# Patient Record
Sex: Female | Born: 1990 | Hispanic: Yes | Marital: Single | Smoking: Never smoker
Health system: Southern US, Community
[De-identification: ages and names within clinical notes are randomized; demographics above are authoritative.]

## PROBLEM LIST (undated history)

## (undated) ENCOUNTER — Inpatient Hospital Stay (HOSPITAL_COMMUNITY): Payer: Self-pay

## (undated) HISTORY — PX: NO PAST SURGERIES: SHX2092

---
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
## 2012-03-17 ENCOUNTER — Inpatient Hospital Stay (HOSPITAL_COMMUNITY)
Admission: AD | Admit: 2012-03-17 | Discharge: 2012-03-18 | DRG: 775 | Disposition: A | Discharge status: Home / Self Care | Payer: Medicaid Other | Source: Ambulatory Visit | Attending: Obstetrics & Gynecology | Admitting: Obstetrics & Gynecology

## 2012-03-17 ENCOUNTER — Inpatient Hospital Stay (HOSPITAL_COMMUNITY): Payer: Medicaid Other | Admitting: Anesthesiology

## 2012-03-17 ENCOUNTER — Encounter (HOSPITAL_COMMUNITY): Payer: Self-pay | Admitting: Anesthesiology

## 2012-03-17 ENCOUNTER — Encounter (HOSPITAL_COMMUNITY): Payer: Self-pay | Admitting: *Deleted

## 2012-03-17 DIAGNOSIS — O0930 Supervision of pregnancy with insufficient antenatal care, unspecified trimester: Secondary | ICD-10-CM

## 2012-03-17 DIAGNOSIS — IMO0001 Reserved for inherently not codable concepts without codable children: Secondary | ICD-10-CM

## 2012-03-17 LAB — CBC
HCT: 32.1 % — ABNORMAL LOW (ref 36.0–46.0)
Hemoglobin: 10.5 g/dL — ABNORMAL LOW (ref 12.0–15.0)
RBC: 3.78 MIL/uL — ABNORMAL LOW (ref 3.87–5.11)
RDW: 14.9 % (ref 11.5–15.5)
WBC: 10.9 10*3/uL — ABNORMAL HIGH (ref 4.0–10.5)

## 2012-03-17 LAB — DIFFERENTIAL
Basophils Absolute: 0 10*3/uL (ref 0.0–0.1)
Lymphocytes Relative: 19 % (ref 12–46)
Monocytes Absolute: 1.2 10*3/uL — ABNORMAL HIGH (ref 0.1–1.0)
Neutro Abs: 7.6 10*3/uL (ref 1.7–7.7)

## 2012-03-17 LAB — RPR: RPR Ser Ql: NONREACTIVE

## 2012-03-17 LAB — RUBELLA SCREEN: Rubella: 187.8 IU/mL — ABNORMAL HIGH

## 2012-03-17 MED ORDER — LACTATED RINGERS IV SOLN: 500.0000 mL | INTRAVENOUS | Status: DC | PRN

## 2012-03-17 MED ORDER — FENTANYL CITRATE 0.05 MG/ML IJ SOLN
50.0000 ug | INTRAMUSCULAR | Status: DC | PRN
  Administered 2012-03-17: 50 ug via INTRAVENOUS

## 2012-03-17 MED ORDER — SIMETHICONE 80 MG PO CHEW: 80.0000 mg | CHEWABLE_TABLET | ORAL | Status: DC | PRN

## 2012-03-17 MED ORDER — MAGNESIUM HYDROXIDE 400 MG/5ML PO SUSP: 30.0000 mL | ORAL | Status: DC | PRN

## 2012-03-17 MED ORDER — OXYCODONE-ACETAMINOPHEN 5-325 MG PO TABS: 1.0000 | ORAL_TABLET | ORAL | Status: DC | PRN

## 2012-03-17 MED ORDER — ONDANSETRON HCL 4 MG PO TABS: 4.0000 mg | ORAL_TABLET | ORAL | Status: DC | PRN

## 2012-03-17 MED ORDER — PRENATAL MULTIVITAMIN CH
1.0000 | ORAL_TABLET | Freq: Every day | ORAL | Status: DC
  Administered 2012-03-17 – 2012-03-18 (×2): 1 via ORAL
  Filled 2012-03-17 (×2): qty 1

## 2012-03-17 MED ORDER — IBUPROFEN 600 MG PO TABS
600.0000 mg | ORAL_TABLET | Freq: Four times a day (QID) | ORAL | Status: DC | PRN
  Administered 2012-03-17: 600 mg via ORAL
  Filled 2012-03-17: qty 1

## 2012-03-17 MED ORDER — FENTANYL 2.5 MCG/ML BUPIVACAINE 1/10 % EPIDURAL INFUSION (WH - ANES)
14.0000 mL/h | INTRAMUSCULAR | Status: DC
  Filled 2012-03-17: qty 125

## 2012-03-17 MED ORDER — INFLUENZA VIRUS VACC SPLIT PF IM SUSP
0.5000 mL | INTRAMUSCULAR | Status: AC
  Administered 2012-03-18: 0.5 mL via INTRAMUSCULAR
  Filled 2012-03-17: qty 0.5

## 2012-03-17 MED ORDER — CITRIC ACID-SODIUM CITRATE 334-500 MG/5ML PO SOLN: 30.0000 mL | ORAL | Status: DC | PRN

## 2012-03-17 MED ORDER — METHYLERGONOVINE MALEATE 0.2 MG/ML IJ SOLN: 0.2000 mg | INTRAMUSCULAR | Status: DC | PRN

## 2012-03-17 MED ORDER — PHENYLEPHRINE 40 MCG/ML (10ML) SYRINGE FOR IV PUSH (FOR BLOOD PRESSURE SUPPORT)
80.0000 ug | PREFILLED_SYRINGE | INTRAVENOUS | Status: DC | PRN
  Filled 2012-03-17: qty 5

## 2012-03-17 MED ORDER — FERROUS SULFATE 325 (65 FE) MG PO TABS
325.0000 mg | ORAL_TABLET | Freq: Two times a day (BID) | ORAL | Status: DC
  Administered 2012-03-17 – 2012-03-18 (×4): 325 mg via ORAL
  Filled 2012-03-17 (×4): qty 1

## 2012-03-17 MED ORDER — TETANUS-DIPHTH-ACELL PERTUSSIS 5-2.5-18.5 LF-MCG/0.5 IM SUSP: 0.5000 mL | Freq: Once | INTRAMUSCULAR | Status: DC

## 2012-03-17 MED ORDER — LANOLIN HYDROUS EX OINT: TOPICAL_OINTMENT | CUTANEOUS | Status: DC | PRN

## 2012-03-17 MED ORDER — SODIUM BICARBONATE 8.4 % IV SOLN
INTRAVENOUS | Status: DC | PRN
  Administered 2012-03-17: 5 mL via EPIDURAL

## 2012-03-17 MED ORDER — MEASLES, MUMPS & RUBELLA VAC ~~LOC~~ INJ
0.5000 mL | INJECTION | Freq: Once | SUBCUTANEOUS | Status: DC
  Filled 2012-03-17: qty 0.5

## 2012-03-17 MED ORDER — ACETAMINOPHEN 325 MG PO TABS: 650.0000 mg | ORAL_TABLET | ORAL | Status: DC | PRN

## 2012-03-17 MED ORDER — METHYLERGONOVINE MALEATE 0.2 MG PO TABS: 0.2000 mg | ORAL_TABLET | ORAL | Status: DC | PRN

## 2012-03-17 MED ORDER — ZOLPIDEM TARTRATE 5 MG PO TABS: 5.0000 mg | ORAL_TABLET | Freq: Every evening | ORAL | Status: DC | PRN

## 2012-03-17 MED ORDER — FENTANYL 2.5 MCG/ML BUPIVACAINE 1/10 % EPIDURAL INFUSION (WH - ANES)
INTRAMUSCULAR | Status: DC | PRN
  Administered 2012-03-17: 14 mL/h via EPIDURAL

## 2012-03-17 MED ORDER — IBUPROFEN 600 MG PO TABS
600.0000 mg | ORAL_TABLET | Freq: Four times a day (QID) | ORAL | Status: DC
  Administered 2012-03-17 – 2012-03-18 (×6): 600 mg via ORAL
  Filled 2012-03-17 (×6): qty 1

## 2012-03-17 MED ORDER — EPHEDRINE 5 MG/ML INJ
10.0000 mg | INTRAVENOUS | Status: DC | PRN
  Filled 2012-03-17: qty 4

## 2012-03-17 MED ORDER — LIDOCAINE HCL (PF) 1 % IJ SOLN
30.0000 mL | INTRAMUSCULAR | Status: DC | PRN
  Filled 2012-03-17: qty 30

## 2012-03-17 MED ORDER — DIBUCAINE 1 % RE OINT: 1.0000 "application " | TOPICAL_OINTMENT | RECTAL | Status: DC | PRN

## 2012-03-17 MED ORDER — ONDANSETRON HCL 4 MG/2ML IJ SOLN: 4.0000 mg | INTRAMUSCULAR | Status: DC | PRN

## 2012-03-17 MED ORDER — SENNOSIDES-DOCUSATE SODIUM 8.6-50 MG PO TABS
2.0000 | ORAL_TABLET | Freq: Every day | ORAL | Status: DC
  Administered 2012-03-17: 2 via ORAL

## 2012-03-17 MED ORDER — FENTANYL CITRATE 0.05 MG/ML IJ SOLN
INTRAMUSCULAR | Status: AC
  Administered 2012-03-17: 50 ug via INTRAVENOUS
  Filled 2012-03-17: qty 2

## 2012-03-17 MED ORDER — ONDANSETRON HCL 4 MG/2ML IJ SOLN: 4.0000 mg | Freq: Four times a day (QID) | INTRAMUSCULAR | Status: DC | PRN

## 2012-03-17 MED ORDER — DIPHENHYDRAMINE HCL 25 MG PO CAPS: 25.0000 mg | ORAL_CAPSULE | Freq: Four times a day (QID) | ORAL | Status: DC | PRN

## 2012-03-17 MED ORDER — WITCH HAZEL-GLYCERIN EX PADS: 1.0000 "application " | MEDICATED_PAD | CUTANEOUS | Status: DC | PRN

## 2012-03-17 MED ORDER — PHENYLEPHRINE 40 MCG/ML (10ML) SYRINGE FOR IV PUSH (FOR BLOOD PRESSURE SUPPORT): 80.0000 ug | PREFILLED_SYRINGE | INTRAVENOUS | Status: DC | PRN

## 2012-03-17 MED ORDER — OXYTOCIN 40 UNITS IN LACTATED RINGERS INFUSION - SIMPLE MED
62.5000 mL/h | Freq: Once | INTRAVENOUS | Status: DC
  Filled 2012-03-17: qty 1000

## 2012-03-17 MED ORDER — DIPHENHYDRAMINE HCL 50 MG/ML IJ SOLN: 12.5000 mg | INTRAMUSCULAR | Status: DC | PRN

## 2012-03-17 MED ORDER — EPHEDRINE 5 MG/ML INJ: 10.0000 mg | INTRAVENOUS | Status: DC | PRN

## 2012-03-17 MED ORDER — LACTATED RINGERS IV SOLN
INTRAVENOUS | Status: DC
  Administered 2012-03-17: 01:00:00 via INTRAVENOUS

## 2012-03-17 MED ORDER — BENZOCAINE-MENTHOL 20-0.5 % EX AERO
1.0000 "application " | INHALATION_SPRAY | CUTANEOUS | Status: DC | PRN
  Administered 2012-03-17: 1 via TOPICAL
  Filled 2012-03-17: qty 56

## 2012-03-17 MED ORDER — OXYTOCIN BOLUS FROM INFUSION
500.0000 mL | Freq: Once | INTRAVENOUS | Status: AC
  Administered 2012-03-17: 500 mL via INTRAVENOUS
  Filled 2012-03-17: qty 500

## 2012-03-17 MED ORDER — LACTATED RINGERS IV SOLN
500.0000 mL | Freq: Once | INTRAVENOUS | Status: AC
  Administered 2012-03-17: 500 mL via INTRAVENOUS

## 2012-03-17 NOTE — Plan of Care (Signed)
Problem: Phase I Progression Outcomes Goal: Obtain and review prenatal records Outcome: Not Met (add Reason) No prentatal records, due to no prenatal care. Only record seen is pt's visit to ER in February 2013 at [facility] 3d gestation.

## 2012-03-17 NOTE — H&P (Signed)
Maria Bruce is a 21 y.o. female G2P1001 [redacted]w[redacted]d by LMP per patient, presenting for painful contractions occurring every 2 minutes. She denies leaking, gush of fluid pr vaginal bleeding. She has felt her baby move today. She reports prenatal care at a "pay as you go facility." She brings no documents with her. She is crying and breathing through contractions. No one is with her currently for support.  Maternal Medical History:  Reason for admission: Reason for admission: contractions.  Reason for Admission:   nauseaContractions: Frequency: regular.   Perceived severity is strong.    Fetal activity: Perceived fetal activity is normal.      OB History    Grav Para Term Preterm Abortions TAB SAB Ect Mult Living   2 1 1       1      Past Medical History  Diagnosis Date  . Asthma    History reviewed. No pertinent past surgical history. Family History: family history includes Diabetes in her paternal grandmother and Hypertension in her paternal grandmother. Social History:  reports that she has never smoked. She does not have any smokeless tobacco history on file. She reports that she does not drink alcohol or use illicit drugs.   Prenatal Transfer Tool  Maternal Diabetes: No Genetic Screening: Declined Maternal Ultrasounds/Referrals: Declined Fetal Ultrasounds or other Referrals:  None Maternal Substance Abuse:  No Significant Maternal Medications:  None Significant Maternal Lab Results:  None Other Comments:  None  Review of Systems  Constitutional: Negative for fever and chills.  Eyes: Negative for blurred vision and double vision.  Respiratory: Negative for cough, shortness of breath and wheezing.   Cardiovascular: Negative for chest pain and palpitations.  Gastrointestinal: Negative for heartburn, nausea, vomiting, diarrhea and constipation.  Genitourinary: Negative for dysuria, urgency and frequency.  Musculoskeletal: Positive for back pain.  Skin: Negative for  rash.  Neurological: Negative for dizziness, weakness and headaches.      Blood pressure 116/65, pulse 76, temperature 98 F (36.7 C), temperature source Oral, resp. rate 20, height 5\' 4"  (1.626 m), weight 86.183 kg (190 lb). Dilation: 8 Effacement (%): 100 Cervical Position: Middle Station: 0 Presentation: Vertex Exam by:: kuneff, r., do   Maternal Exam:  Uterine Assessment: Contraction strength is firm.  Contraction duration is 1 minute. Contraction frequency is regular.   Abdomen: Fetal presentation: vertex  Introitus: Normal vulva. Normal vagina.  Vagina is negative for discharge.  Ferning test: not done.  Nitrazine test: not done.  Pelvis: adequate for delivery.   Cervix: Cervix evaluated by digital exam.     Fetal Exam Fetal Monitor Review: Variability: moderate (6-25 bpm).   Pattern: accelerations present and no decelerations.    Fetal State Assessment: Category I - tracings are normal.     Physical Exam  Constitutional: She is oriented to person, place, and time. She appears well-developed and well-nourished. No distress.  HENT:  Head: Normocephalic and atraumatic.  Eyes: Conjunctivae normal are normal. Pupils are equal, round, and reactive to light. Right eye exhibits no discharge. Left eye exhibits no discharge. No scleral icterus.  Neck: Neck supple.  Cardiovascular: Normal rate, regular rhythm and normal heart sounds.   No murmur heard. Respiratory: No respiratory distress. She has no wheezes. She has no rales.  GI: She exhibits no distension. There is no tenderness.       Gravid with firm contractions  Genitourinary: Vagina normal. No vaginal discharge found.  Musculoskeletal: She exhibits no edema and no tenderness.  Neurological: She is alert  and oriented to person, place, and time.  Skin: Skin is warm and dry. No rash noted. She is not diaphoretic.  Psychiatric: She has a normal mood and affect.    Results for orders placed during the hospital  encounter of 03/17/12 (from the past 48 hour(s))  CBC     Status: Abnormal   Collection Time   03/17/12  1:20 AM      Component Value Range Comment   WBC 10.9 (*) 4.0 - 10.5 K/uL    RBC 3.78 (*) 3.87 - 5.11 MIL/uL    Hemoglobin 10.5 (*) 12.0 - 15.0 g/dL    HCT 16.1 (*) 09.6 - 46.0 %    MCV 84.9  78.0 - 100.0 fL    MCH 27.8  26.0 - 34.0 pg    MCHC 32.7  30.0 - 36.0 g/dL    RDW 04.5  40.9 - 81.1 %    Platelets 206  150 - 400 K/uL   DIFFERENTIAL     Status: Abnormal   Collection Time   03/17/12  1:20 AM      Component Value Range Comment   Neutrophils Relative 69  43 - 77 %    Neutro Abs 7.6  1.7 - 7.7 K/uL    Lymphocytes Relative 19  12 - 46 %    Lymphs Abs 2.1  0.7 - 4.0 K/uL    Monocytes Relative 11  3 - 12 %    Monocytes Absolute 1.2 (*) 0.1 - 1.0 K/uL    Eosinophils Relative 1  0 - 5 %    Eosinophils Absolute 0.1  0.0 - 0.7 K/uL    Basophils Relative 0  0 - 1 %    Basophils Absolute 0.0  0.0 - 0.1 K/uL   RAPID HIV SCREEN ([facility])     Status: Normal   Collection Time   03/17/12  1:20 AM      Component Value Range Comment   SUDS Rapid HIV Screen NON REACTIVE  NON REACTIVE     Prenatal labs: Pending ABO, Rh: --/--/B POS (02/21 0138) Antibody:   Rubella:   RPR:    HBsAg:    HIV:   Negative GBS:     Assessment/Plan: 1. Active labor, presumed term 2. No prenatal care 3. OB profile pending, GBS PCR pending 4. Transfer to LD with admission orders 5. Anticipate NSVD   Felix Pacini 03/17/2012, 1:06 AM

## 2012-03-17 NOTE — Progress Notes (Signed)
UR Chart review completed.  

## 2012-03-17 NOTE — Anesthesia Preprocedure Evaluation (Signed)
Anesthesia Evaluation  Patient identified by MRN, date of birth, ID band Patient awake    Reviewed: Allergy & Precautions, H&P , Patient's Chart, lab work & pertinent test results  Airway Mallampati: II TM Distance: >3 FB Neck ROM: full    Dental  (+) Teeth Intact   Pulmonary asthma (no inhaler) ,  breath sounds clear to auscultation        Cardiovascular Rhythm:regular Rate:Normal     Neuro/Psych    GI/Hepatic   Endo/Other    Renal/GU      Musculoskeletal   Abdominal   Peds  Hematology   Anesthesia Other Findings       Reproductive/Obstetrics (+) Pregnancy                           Anesthesia Physical Anesthesia Plan  ASA: II  Anesthesia Plan: Epidural   Post-op Pain Management:    Induction:   Airway Management Planned:   Additional Equipment:   Intra-op Plan:   Post-operative Plan:   Informed Consent: I have reviewed the patients History and Physical, chart, labs and discussed the procedure including the risks, benefits and alternatives for the proposed anesthesia with the patient or authorized representative who has indicated his/her understanding and acceptance.   Dental Advisory Given  Plan Discussed with:   Anesthesia Plan Comments: (Labs checked- platelets confirmed with RN in room. Fetal heart tracing, per RN, reported to be stable enough for sitting procedure. Discussed epidural, and patient consents to the procedure:  included risk of possible headache,backache, failed block, allergic reaction, and nerve injury. This patient was asked if she had any questions or concerns before the procedure started. )        Anesthesia Quick Evaluation  

## 2012-03-17 NOTE — Anesthesia Procedure Notes (Signed)
Epidural Patient location during procedure: OB  Preanesthetic Checklist Completed: patient identified, site marked, surgical consent, pre-op evaluation, timeout performed, IV checked, risks and benefits discussed and monitors and equipment checked  Epidural Patient position: sitting Prep: site prepped and draped and DuraPrep Patient monitoring: continuous pulse ox and blood pressure Approach: midline Injection technique: LOR air  Needle:  Needle type: Tuohy  Needle gauge: 17 G Needle length: 9 cm and 9 Needle insertion depth: 5 cm cm Catheter type: closed end flexible Catheter size: 19 Gauge Catheter at skin depth: 11 cm Test dose: negative  Assessment Events: blood not aspirated, injection not painful, no injection resistance, negative IV test and no paresthesia  Additional Notes Dosing of Epidural:  1st dose, through catheter ............................................. epi 1:200K + Xylocaine 40 mg  2nd dose, through catheter, after waiting 3 minutes.....epi 1:200K + Xylocaine 60 mg    ( 2% Xylo charted as a single dose in Epic Meds for ease of charting; actual dosing was fractionated as above, for saftey's sake)  As each dose occurred, patient was free of IV sx; and patient exhibited no evidence of SA injection.  Patient is more comfortable after epidural dosed. Please see RN's note for documentation of vital signs,and FHR which are stable.  Patient reminded not to try to ambulate with numb legs, and that an RN must be present when she attempts to get up.       

## 2012-03-18 ENCOUNTER — Ambulatory Visit (INDEPENDENT_AMBULATORY_CARE_PROVIDER_SITE_OTHER): Payer: Medicaid Other | Admitting: *Deleted

## 2012-03-18 VITALS — BP 123/72 | HR 87 | Temp 98.8°F | Ht 64.0 in | Wt 194.1 lb

## 2012-03-18 DIAGNOSIS — Z3049 Encounter for surveillance of other contraceptives: Secondary | ICD-10-CM

## 2012-03-18 MED ORDER — MEDROXYPROGESTERONE ACETATE 150 MG/ML IM SUSP
150.0000 mg | Freq: Once | INTRAMUSCULAR | Status: AC
  Administered 2012-03-18: 150 mg via INTRAMUSCULAR

## 2012-03-18 MED ORDER — LANOLIN HYDROUS EX OINT: 1.0000 "application " | TOPICAL_OINTMENT | CUTANEOUS | Status: DC | PRN

## 2012-03-18 NOTE — Discharge Summary (Signed)
Obstetric Discharge Summary Reason for Admission: onset of labor Prenatal Procedures: none Intrapartum Procedures: spontaneous vaginal delivery; epidural Postpartum Procedures: none Complications-Operative and Postpartum: none Hemoglobin  Date Value Range Status  03/17/2012 10.5* 12.0 - 15.0 g/dL Final     HCT  Date Value Range Status  03/17/2012 32.1* 36.0 - 46.0 % Final    Physical Exam:  General: alert and cooperative Lochia: appropriate Uterine Fundus: firm Incision: none DVT Evaluation: No evidence of DVT seen on physical exam. Negative Homan's sign. No cords or calf tenderness.  Discharge Diagnoses: Term Pregnancy-delivered  Discharge Information: Date: 03/18/2012 Activity: pelvic rest Diet: routine for mother, bottle feeding for baby Medications: None Condition: stable Birth Control: desires Depo Shot Instructions: refer to AVS Discharge to: home   Newborn Data: Live born female  Birth Weight: 6 lb 5.4 oz (2875 g) APGAR: 9, 9  Home with mother.  Felix Pacini 03/18/2012, 8:06 AM  I have seen and examined this patient and I agree with the above. She will have her PP appt at the [facility]- office requested to make an appt for her. She will also have a SW consult due to no prenatal care prior to her discharge. NATHAN, GOPALAKRISHNAN 9:37 AM 03/18/2012

## 2012-03-18 NOTE — Anesthesia Postprocedure Evaluation (Signed)
Anesthesia Post Note  Patient: Maria Bruce  Procedure(s) Performed: * No procedures listed *  Anesthesia type: Epidural  Patient location: Mother/Baby  Post pain: Pain level controlled  Post assessment: Post-op Vital signs reviewed  Last Vitals:  Filed Vitals:   03/18/12 0540  BP: 91/51  Pulse: 82  Temp: 36.6 C  Resp: 18    Post vital signs: Reviewed  Level of consciousness: awake  Complications: No apparent anesthesia complications

## 2012-03-18 NOTE — Clinical Social Work Maternal (Signed)
Clinical Social Work Department PSYCHOSOCIAL ASSESSMENT - MATERNAL/CHILD 03/18/2012  Patient:  Maria Bruce,Maria Bruce  Account Number:  [id number]  Admit Date:  03/17/2012  Childs Name:   Isaiah Rodriguez    Clinical Social Worker:  Dany Walther, LCSW   Date/Time:  03/18/2012 10:20 AM  Date Referred:  03/18/2012   Referral source  CN     Referred reason  Other - See comment   Other referral source:   Referral reason is NPNC    I:  FAMILY / HOME ENVIRONMENT Child's legal guardian:  PARENT  Guardian - Name Guardian - Age Guardian - Address  Maria Bruce 21 [address] [place], [place] [address]  Edwin Rodriguez 22 same   Other household support members/support persons Name Relationship DOB  MOB's grandmother     Other support:   MOB listed her children's Godparents as good support people, as well as FOB, his family and MOB's grandmother. MOB states her parents are not in her life and that she was raised by her grandmother.    II  PSYCHOSOCIAL DATA Information Source:  Patient Interview  Financial and Community Resources Employment:   FOB works for a trucking/shipping company and often travels out of town.   Financial resources:  Medicaid If Medicaid - County:  [place]  School / Grade:   Maternity Care Coordinator / Child Services Coordination / Early Interventions:  Cultural issues impacting care:   None identified.    III  STRENGTHS Strengths  Supportive family/friends  Other - See comment  Home prepared for Child (including basic supplies)  Compliance with medical plan  Adequate Resources   Strength comment:  Pediatric follow up will be at [facility].   IV  RISK FACTORS AND CURRENT PROBLEMS Current Problem:  None   Risk Factor & Current Problem Patient Issue Family Issue Risk Factor / Current Problem Comment   N N     V  SOCIAL WORK ASSESSMENT SW met with MOB to complete assessment and discuss NPNC. MOB states  she moved back to [place] about 6 months ago and didn't know if ishe was going to stay.  By the time she decided that she was staying, it took too long to get her Medicaid approved to go to the doctor.  She states she went to the Urgent Care during her pregnancy since she did not have an OB.  SW explained hospital drug screen policy and MOB states no concerns and denies any drug use.  SW asked about her reasons for moving here and why she might not stay.  She states she moved here with her grandmother when she was a minor and didn't like it here. Her grandmother let her move back to [place] when she got older and she stayed with friends.  She then decided to move back here when she got pregnant with this baby.  She thinks she will stay here at this point.  She lives with her grandmother, whom she says is supportive.  She and FOB are together and he is involved, but often is out of town with his job.  She states he is the father of her 3 year old daughter also.  She reports having everything she needs for baby at home and no issues with transportation.  She is waiting on FOB to return from a job in [place] and is not sure if he will be back today or tomorrow.  She states he has the car seat and she doesn't think she   wants to discharge before he arrives.  SW explained that a typical stay after a vaginal delivery is 2 nights, but that she can go after 24 hours if she and baby are medically cleared.  SW explained that if she does not feel comfortable with an early discharge, she can stay another night.  SW asked her to discuss this with her RN.  She agreed.      VI SOCIAL WORK PLAN Social Work Plan  No Further Intervention Required / No Barriers to Discharge   Type of pt/family education:   If child protective services report - county:   If child protective services report - date:   Information/referral to community resources comment:   No needs identified.   Other social work plan:   SW will monitor  meconium drug screen.  Baby's UDS is negative. 

## 2012-03-22 NOTE — Discharge Summary (Signed)
Attestation of Attending Supervision of Advanced Practitioner (CNM/NP): Evaluation and management procedures were performed by the Advanced Practitioner under my supervision and collaboration.  I have reviewed the Advanced Practitioner's note and chart, and I agree with the management and plan.  Heath Badon, MD, FACOG Attending Obstetrician & Gynecologist Faculty Practice, [facility]  

## 2012-04-15 ENCOUNTER — Ambulatory Visit (INDEPENDENT_AMBULATORY_CARE_PROVIDER_SITE_OTHER): Payer: Self-pay | Admitting: Advanced Practice Midwife

## 2012-04-15 ENCOUNTER — Other Ambulatory Visit (HOSPITAL_COMMUNITY)
Admission: RE | Admit: 2012-04-15 | Discharge: 2012-04-15 | Disposition: A | Discharge status: Home / Self Care | Payer: Medicaid Other | Source: Ambulatory Visit | Attending: Advanced Practice Midwife | Admitting: Advanced Practice Midwife

## 2012-04-15 ENCOUNTER — Encounter: Payer: Self-pay | Admitting: Advanced Practice Midwife

## 2012-04-15 VITALS — BP 106/71 | HR 91 | Temp 99.0°F | Ht 64.0 in | Wt 184.7 lb

## 2012-04-15 DIAGNOSIS — N898 Other specified noninflammatory disorders of vagina: Secondary | ICD-10-CM

## 2012-04-15 DIAGNOSIS — IMO0001 Reserved for inherently not codable concepts without codable children: Secondary | ICD-10-CM

## 2012-04-15 DIAGNOSIS — Z8742 Personal history of other diseases of the female genital tract: Secondary | ICD-10-CM

## 2012-04-15 DIAGNOSIS — L293 Anogenital pruritus, unspecified: Secondary | ICD-10-CM

## 2012-04-15 DIAGNOSIS — N760 Acute vaginitis: Secondary | ICD-10-CM | POA: Insufficient documentation

## 2012-04-15 MED ORDER — MEDROXYPROGESTERONE ACETATE 150 MG/ML IM SUSP: 150.0000 mg | INTRAMUSCULAR | Status: DC

## 2012-04-15 NOTE — Patient Instructions (Signed)

## 2012-04-15 NOTE — Progress Notes (Signed)
  Subjective:     Maria Bruce is a 21 y.o. female who presents for a postpartum visit. She is 4 weeks postpartum following a spontaneous vaginal delivery. I have fully reviewed the prenatal and intrapartum course. The delivery was at term. Outcome: spontaneous vaginal delivery. Anesthesia: epidural. Postpartum course has been uneventful except for some dizziness once or twice a day and continued light red bleeding since delivery. Baby's course has been uneventful. Baby is feeding by bottle - . Bleeding red but light.  Bowel function is normal. Bladder function is normal. Patient is not sexually active. Contraception method is Depo-Provera injections. Postpartum depression screening: negative.  The following portions of the patient's history were reviewed and updated as appropriate: allergies, current medications, past family history, past medical history, past social history, past surgical history and problem list.  Review of Systems Pertinent items are noted in HPI.  Does report dizziness once or twice a day, relieved with rest. Still has red blood, light. Has some intermittent itching vaginally.  Objective:    BP 106/71  Pulse 91  Temp 99 F (37.2 C) (Oral)  Ht 5\' 4"  (1.626 m)  Wt 184 lb 11.2 oz (83.779 kg)  BMI 31.70 kg/m2  Breastfeeding? No  General:  alert, cooperative and no distress   Breasts:  inspection negative, no nipple discharge or bleeding, no masses or nodularity palpable  Lungs: clear to auscultation bilaterally  Heart:  regular rate and rhythm, S1, S2 normal, no murmur, click, rub or gallop  Abdomen: soft, non-tender; bowel sounds normal; no masses,  no organomegaly   Vulva:  normal and minimal erethema  Vagina: normal vagina and small amount red blood  Cervix:  multiparous appearance  Corpus: normal size, contour, position, consistency, mobility, non-tender  Adnexa:  normal adnexa  Rectal Exam: Not performed.        Assessment:    Normal postpartum exam.  Pap smear not done at today's visit.   Plan:    1. Contraception: Depo-Provera injections 2. Wet Prep sent 3. Follow up in: 3 months or as needed.

## 2012-04-23 MED ORDER — METRONIDAZOLE 500 MG PO TABS: 500.0000 mg | ORAL_TABLET | Freq: Two times a day (BID) | ORAL | Status: DC

## 2012-04-23 NOTE — Addendum Note (Signed)
Addended by: Wynelle Bourgeois L on: 04/23/2012 12:30 AM   Modules accepted: Orders

## 2012-04-25 ENCOUNTER — Telehealth: Payer: Self-pay | Admitting: Medical

## 2012-04-25 NOTE — Telephone Encounter (Signed)
Attempted to contact patient with information about BV and Rx at pharmacy. Patient home number has message stating that "customer is unavailable at this time" there was no VM set up. Patient's cell number is a wrong number.

## 2012-04-25 NOTE — Telephone Encounter (Signed)
Message copied by Freddi Starr on Mon Apr 25, 2012  9:27 AM ------      Message from: Aviva Signs      Created: Sat Apr 23, 2012 12:29 AM      Regarding: Rx for BV       I will put in an Rx for BV            Can you call her to tell her?            Thanks

## 2012-04-26 NOTE — Telephone Encounter (Signed)
Attempted to contact patient. Messages states "customer you have called is unavailable." unable to leave msg.

## 2012-04-27 ENCOUNTER — Encounter: Payer: Self-pay | Admitting: *Deleted

## 2012-04-27 NOTE — Telephone Encounter (Signed)
Letter mailed to patient.

## 2012-05-02 ENCOUNTER — Telehealth: Payer: Self-pay | Admitting: *Deleted

## 2012-05-02 MED ORDER — METRONIDAZOLE 500 MG PO TABS: 500.0000 mg | ORAL_TABLET | Freq: Two times a day (BID) | ORAL | Status: DC

## 2012-05-02 NOTE — Telephone Encounter (Signed)
Pt left message requesting her Rx to be sent to a different [facility] pharmacy. I called pt and stated that I will re-send the Rx and correct the location in her chart. We discussed the results of her test which was positive for BV.  Pt voiced understanding.

## 2012-06-06 ENCOUNTER — Ambulatory Visit: Payer: Self-pay

## 2012-06-15 ENCOUNTER — Emergency Department (HOSPITAL_COMMUNITY)
Admission: EM | Admit: 2012-06-15 | Discharge: 2012-06-16 | Disposition: A | Discharge status: Home / Self Care | Payer: Medicaid Other | Attending: Emergency Medicine | Admitting: Emergency Medicine

## 2012-06-15 ENCOUNTER — Encounter (HOSPITAL_COMMUNITY): Payer: Self-pay | Admitting: Adult Health

## 2012-06-15 DIAGNOSIS — Y9289 Other specified places as the place of occurrence of the external cause: Secondary | ICD-10-CM | POA: Insufficient documentation

## 2012-06-15 DIAGNOSIS — Z79899 Other long term (current) drug therapy: Secondary | ICD-10-CM | POA: Insufficient documentation

## 2012-06-15 DIAGNOSIS — R51 Headache: Secondary | ICD-10-CM

## 2012-06-15 DIAGNOSIS — S1093XA Contusion of unspecified part of neck, initial encounter: Secondary | ICD-10-CM

## 2012-06-15 DIAGNOSIS — Y939 Activity, unspecified: Secondary | ICD-10-CM | POA: Insufficient documentation

## 2012-06-15 DIAGNOSIS — S0003XA Contusion of scalp, initial encounter: Secondary | ICD-10-CM | POA: Insufficient documentation

## 2012-06-15 DIAGNOSIS — W2209XA Striking against other stationary object, initial encounter: Secondary | ICD-10-CM | POA: Insufficient documentation

## 2012-06-15 DIAGNOSIS — J45909 Unspecified asthma, uncomplicated: Secondary | ICD-10-CM | POA: Insufficient documentation

## 2012-06-15 MED ORDER — TRAMADOL HCL 50 MG PO TABS
50.0000 mg | ORAL_TABLET | Freq: Once | ORAL | Status: AC
  Administered 2012-06-15: 50 mg via ORAL
  Filled 2012-06-15: qty 1

## 2012-06-15 NOTE — ED Notes (Addendum)
Reports fall in bathroom one hour ago. She slipped on a rug and hit her feet against the wall to break fall, but hit the occiput of her head on the bathtub. C/o neck and head pain. Denies loc. No deformity. C/o dizziness, denies nausea. C-Collar applied.  C/o right arm pain, no deformity.

## 2012-06-15 NOTE — ED Provider Notes (Signed)
History     CSN: [id number]  Arrival date & time 06/15/12  2208   First MD Initiated Contact with Patient 06/15/12 2338      Chief Complaint  Patient presents with  . Fall    (Consider location/radiation/quality/duration/timing/severity/associated sxs/prior treatment) HPI Comments: 22 year old female who presents after having a mechanical fall when she slipped on a bathroom rug in her bathroom. This occurred just prior to arrival, she states that she struck her upper neck on the edge of the bathtub. She had no loss of consciousness, no weakness numbness difficulty walking or visual changes and no nausea or vomiting. She denies any ringing in her ears, difficulty breathing or difficulty ambulating. She does have a persistent pain in her upper neck and the right and left sides of her neck. She has had no medication prior to arrival. She also relates having mild pain in her right knee as well as her right upper extremity above the elbow. She has been able to use these extremities with minimal difficulty.  Patient is a 22 y.o. female presenting with fall. The history is provided by the patient and the spouse.  Fall Associated symptoms include headaches. Pertinent negatives include no numbness, no nausea and no vomiting.    Past Medical History  Diagnosis Date  . Asthma     Past Surgical History  Procedure Date  . No past surgeries     Family History  Problem Relation Age of Onset  . Hypertension Paternal Grandmother   . Diabetes Paternal Grandmother     History  Substance Use Topics  . Smoking status: Never Smoker   . Smokeless tobacco: Never Used  . Alcohol Use: No    OB History    Grav Para Term Preterm Abortions TAB SAB Ect Mult Living   2 2 2  0 0 0 0 0 0 2      Review of Systems  Constitutional: Negative for activity change.  HENT: Positive for neck pain.   Eyes: Negative for visual disturbance.       No blurred or double vision  Cardiovascular: Negative for  palpitations.  Gastrointestinal: Negative for nausea and vomiting.  Musculoskeletal: Negative for back pain and gait problem.  Skin: Negative for wound.  Neurological: Positive for headaches. Negative for dizziness, speech difficulty, weakness, light-headedness and numbness.  Hematological: Does not bruise/bleed easily.  Psychiatric/Behavioral: Negative for behavioral problems and agitation.    Allergies  Review of patient's allergies indicates no known allergies.  Home Medications   Current Outpatient Rx  Name  Route  Sig  Dispense  Refill  . NAPROXEN 500 MG PO TABS   Oral   Take 1 tablet (500 mg total) by mouth 2 (two) times daily with a meal.   30 tablet   0     BP 119/72  Pulse 97  Temp 97.9 F (36.6 C) (Oral)  Resp 18  SpO2 100%  Physical Exam  Nursing note and vitals reviewed. Constitutional: She appears well-developed and well-nourished. No distress.  HENT:  Head: Normocephalic and atraumatic.  Mouth/Throat: Oropharynx is clear and moist. No oropharyngeal exudate.       no facial tenderness, deformity, malocclusion or hemotympanum.  no battle's sign or racoon eyes.   Eyes: Conjunctivae normal and EOM are normal. Pupils are equal, round, and reactive to light. Right eye exhibits no discharge. Left eye exhibits no discharge. No scleral icterus.  Neck: Normal range of motion. Neck supple. No JVD present. No thyromegaly present.  Cardiovascular: Normal  rate, regular rhythm, normal heart sounds and intact distal pulses.  Exam reveals no gallop and no friction rub.   No murmur heard.      Normal pulses at the bilateral upper extremities at the radial arteries.  Pulmonary/Chest: Effort normal and breath sounds normal. No respiratory distress. She has no wheezes. She has no rales.  Abdominal: Soft. Bowel sounds are normal. She exhibits no distension and no mass. There is no tenderness.  Musculoskeletal: Normal range of motion. She exhibits tenderness. She exhibits no  edema.       Mild tenderness to palpation over the upper cervical spine as well as the paracervical muscles of the upper neck. No tenderness over the thoracic or lumbar spines. Mild tenderness just inferior to the patellar tendon on the right, normal range of motion of the right knee, normal gait. Mild tenderness to the right upper extremity at the shoulder girdle, slight decreased range of motion secondary to pain though very supple joints and no deformity, swelling or bruising.  Lymphadenopathy:    She has no cervical adenopathy.  Neurological: She is alert. Coordination normal.       Gait is normal, speech is clear, follows commands without difficulty, moves all extremities, pupils are equal round and reactive to light and cranial nerves III through XII are intact. The patient is alert and oriented to her name, date, her location. She answers all questions appropriately  Skin: Skin is warm and dry. No rash noted. No erythema.  Psychiatric: She has a normal mood and affect. Her behavior is normal.    ED Course  Procedures (including critical care time)  Labs Reviewed - No data to display Dg Cervical Spine Complete  06/16/2012  *RADIOLOGY REPORT*  Clinical Data: Fall with a blow to the back of the head.  CERVICAL SPINE - COMPLETE 4+ VIEW  Comparison: None.  Findings: Vertebral body height and alignment are normal. Intervertebral disc space height is maintained.  Prevertebral soft tissues appear normal.  Lung apices are clear.  IMPRESSION: Normal exam.   Original Report Authenticated By: Holley Dexter, M.D.      1. Contusion of neck   2. Headache       MDM  The patient is well-appearing, normal vital signs, normal neurologic exam including a clear sensorium, normal strength and sensation, normal gait and level of alertness. Will image her cervical spine with plain radiography to evaluate for fracture.   Oral medication ordered in the form of ultram  X-rays negative for fracture, patient  stable for discharge, neurologically intact, reevaluated and moving ostomies and ambulatory, C. collar removed.      Vida Roller, MD 06/16/12 [phone]

## 2012-06-16 ENCOUNTER — Emergency Department (HOSPITAL_COMMUNITY): Payer: Medicaid Other

## 2012-06-16 MED ORDER — NAPROXEN 500 MG PO TABS: 500.0000 mg | ORAL_TABLET | Freq: Two times a day (BID) | ORAL | Status: DC

## 2012-06-16 NOTE — ED Notes (Signed)
Pt states she fell in the bathtub. Pt currently has a neck brace on. Pt states she feels no pain in her neck. Pt is complaining of headache

## 2012-06-16 NOTE — ED Notes (Signed)
Patient transported to X-ray 

## 2013-04-29 ENCOUNTER — Emergency Department (HOSPITAL_COMMUNITY): Payer: Medicaid Other

## 2013-04-29 ENCOUNTER — Emergency Department (HOSPITAL_COMMUNITY)
Admission: EM | Admit: 2013-04-29 | Discharge: 2013-04-29 | Disposition: A | Discharge status: Home / Self Care | Payer: Medicaid Other | Attending: Emergency Medicine | Admitting: Emergency Medicine

## 2013-04-29 ENCOUNTER — Encounter (HOSPITAL_COMMUNITY): Payer: Self-pay | Admitting: Emergency Medicine

## 2013-04-29 DIAGNOSIS — R0789 Other chest pain: Secondary | ICD-10-CM | POA: Insufficient documentation

## 2013-04-29 DIAGNOSIS — F419 Anxiety disorder, unspecified: Secondary | ICD-10-CM

## 2013-04-29 DIAGNOSIS — R079 Chest pain, unspecified: Secondary | ICD-10-CM

## 2013-04-29 DIAGNOSIS — J45901 Unspecified asthma with (acute) exacerbation: Secondary | ICD-10-CM | POA: Insufficient documentation

## 2013-04-29 DIAGNOSIS — F411 Generalized anxiety disorder: Secondary | ICD-10-CM | POA: Insufficient documentation

## 2013-04-29 LAB — BASIC METABOLIC PANEL
BUN: 3 mg/dL — ABNORMAL LOW (ref 6–23)
Chloride: 99 mEq/L (ref 96–112)
Creatinine, Ser: 0.47 mg/dL — ABNORMAL LOW (ref 0.50–1.10)
Glucose, Bld: 82 mg/dL (ref 70–99)
Potassium: 3.6 mEq/L (ref 3.5–5.1)

## 2013-04-29 LAB — CBC
HCT: 34.4 % — ABNORMAL LOW (ref 36.0–46.0)
Hemoglobin: 11.6 g/dL — ABNORMAL LOW (ref 12.0–15.0)
MCHC: 33.7 g/dL (ref 30.0–36.0)
MCV: 84.9 fL (ref 78.0–100.0)
RDW: 14.7 % (ref 11.5–15.5)
WBC: 8.6 10*3/uL (ref 4.0–10.5)

## 2013-04-29 NOTE — ED Notes (Signed)
Pt alert and mentating appropriately upon d/c. Pt leaving with grandfather. Pt given d/c teaching and has no further questions upon d/c. Pt ambulatory leaving ER with steady gait. NAD noted upon d/c. Pt denies need for further questions.

## 2013-04-29 NOTE — ED Provider Notes (Signed)
CSN: [id number]     Arrival date & time 04/29/13  1343 History   First MD Initiated Contact with Patient 04/29/13 1404     Chief Complaint  Patient presents with  . Chest Pain   (Consider location/radiation/quality/duration/timing/severity/associated sxs/prior Treatment) The history is provided by the patient and medical records.   This is a 22 year old female with past medical history significant for asthma, presenting to the ED for intermittent episodes of chest pain and shortness of breath over the past week. Pain is described as a sharp, stabbing sensation in her back which radiates to her anterior chest. Patient states she feels like she is suffocating when pain occurs. Patient states symptoms seem to occur following verbal arguments with her grandmother who recently moved back into the house with her family.  States they have been having verbal arguments for the past 2 months, but more frequently over the past 2 weeks.  Denies physical or sexual abuse of any kind.  Pt denies prior hx of anxiety or depression.  Denies SI/HI/AVH.  Pt has never seen a psychiatrist or a counselor but is interested in seeing one to help manage her stress.  Past Medical History  Diagnosis Date  . Asthma    Past Surgical History  Procedure Laterality Date  . No past surgeries     Family History  Problem Relation Age of Onset  . Hypertension Paternal Grandmother   . Diabetes Paternal Grandmother    History  Substance Use Topics  . Smoking status: Never Smoker   . Smokeless tobacco: Never Used  . Alcohol Use: No   OB History   Grav Para Term Preterm Abortions TAB SAB Ect Mult Living   2 2 2  0 0 0 0 0 0 2     Review of Systems  Respiratory: Positive for shortness of breath.   Cardiovascular: Positive for chest pain.  All other systems reviewed and are negative.    Allergies  Review of patient's allergies indicates no known allergies.  Home Medications  No current outpatient prescriptions  on file. BP 112/58  Pulse 87  Temp(Src) 98.4 F (36.9 C) (Oral)  Resp 16  SpO2 99%  LMP 03/31/2013  Physical Exam  Nursing note and vitals reviewed. Constitutional: She is oriented to person, place, and time. She appears well-developed and well-nourished.  HENT:  Head: Normocephalic and atraumatic.  Mouth/Throat: Oropharynx is clear and moist.  Eyes: Conjunctivae and EOM are normal. Pupils are equal, round, and reactive to light.  Neck: Normal range of motion.  Cardiovascular: Normal rate, regular rhythm and normal heart sounds.   Pulmonary/Chest: Effort normal and breath sounds normal.  Abdominal: Soft. Bowel sounds are normal. There is no tenderness. There is no guarding.  Musculoskeletal: Normal range of motion.  Neurological: She is alert and oriented to person, place, and time.  Skin: Skin is warm and dry.  Psychiatric: Her speech is normal. Her mood appears anxious.  Pt appears somewhat anxious, tearful    ED Course  Procedures (including critical care time)   Date: 04/29/2013  Rate: 91  Rhythm: normal sinus rhythm  QRS Axis: normal  Intervals: normal  ST/T Wave abnormalities: normal  Conduction Disutrbances:none  Narrative Interpretation:   Old EKG Reviewed: none available  Labs Review Labs Reviewed  CBC - Abnormal; Notable for the following:    Hemoglobin 11.6 (*)    HCT 34.4 (*)    All other components within normal limits  BASIC METABOLIC PANEL - Abnormal; Notable for the following:  Sodium 134 (*)    BUN 3 (*)    Creatinine, Ser 0.47 (*)    All other components within normal limits  POCT I-STAT TROPONIN I   Imaging Review Dg Chest 2 View  04/29/2013   CLINICAL DATA:  Chest pain.  EXAM: CHEST  2 VIEW  COMPARISON:  None.  FINDINGS: The heart size and mediastinal contours are within normal limits. Left lung is clear. Ill-defined density is noted in the right lung base which may represent subsegmental atelectasis or possibly early pneumonia. The  visualized skeletal structures are unremarkable.  IMPRESSION: Ill-defined density seen in right lung base concerning for subsegmental atelectasis or early pneumonia. Followup radiographs are recommended.   Electronically Signed   By: Roque Lias M.D.   On: 04/29/2013 15:38    EKG Interpretation   None       MDM   1. Chest pain   2. Anxiety    EKG NSR, no acute ischemic changes.  Trop negative.  Labs as above.  CXR with questionable early pneumonia vs atelectasis-- pt has no cough, congestion, or fever to suggest pneumonia.  At this time i have low suspicion for ACS, PE, dissection, or other acute cardiac event at this time-- symptoms likely stress and anxiety related. Patient will followup with behavioral health, resource guide given.  Discussed plan with pt, she agreed.  Return precautions advised.  Discussed pt with Dr. Adriana Simas who agrees with assessment and plan of care.  Garlon Hatchet, PA-C 04/29/13 1616

## 2013-04-29 NOTE — ED Notes (Signed)
Pt reports she was sleeping this morning and a "Stabbing pain in my back" woke her up. States once awake she noticed the pain started to radiate into her chest and "it felt like i couldn't breathe." states pain has remained constant since onset

## 2013-04-30 NOTE — ED Provider Notes (Signed)
Medical screening examination/treatment/procedure(s) were conducted as a shared visit with non-physician practitioner(s) and myself.  I personally evaluated the patient during the encounter.  EKG Interpretation   None      Patient is low risk for acute coronary syndrome or pulmonary embolism.  Donnetta Hutching, MD 04/30/13 [phone]

## 2013-06-05 ENCOUNTER — Emergency Department (HOSPITAL_COMMUNITY)
Admission: EM | Admit: 2013-06-05 | Discharge: 2013-06-05 | Disposition: A | Discharge status: Home / Self Care | Payer: Medicaid Other | Attending: Emergency Medicine | Admitting: Emergency Medicine

## 2013-06-05 ENCOUNTER — Encounter (HOSPITAL_COMMUNITY): Payer: Self-pay | Admitting: Emergency Medicine

## 2013-06-05 DIAGNOSIS — H53149 Visual discomfort, unspecified: Secondary | ICD-10-CM | POA: Insufficient documentation

## 2013-06-05 DIAGNOSIS — H5712 Ocular pain, left eye: Secondary | ICD-10-CM

## 2013-06-05 DIAGNOSIS — H5710 Ocular pain, unspecified eye: Secondary | ICD-10-CM | POA: Insufficient documentation

## 2013-06-05 DIAGNOSIS — H9209 Otalgia, unspecified ear: Secondary | ICD-10-CM | POA: Insufficient documentation

## 2013-06-05 DIAGNOSIS — R51 Headache: Secondary | ICD-10-CM | POA: Insufficient documentation

## 2013-06-05 DIAGNOSIS — J45909 Unspecified asthma, uncomplicated: Secondary | ICD-10-CM | POA: Insufficient documentation

## 2013-06-05 MED ORDER — FLUORESCEIN SODIUM 1 MG OP STRP
1.0000 | ORAL_STRIP | Freq: Once | OPHTHALMIC | Status: AC
  Administered 2013-06-05: 1 via OPHTHALMIC
  Filled 2013-06-05: qty 1

## 2013-06-05 MED ORDER — TETRACAINE HCL 0.5 % OP SOLN
2.0000 [drp] | Freq: Once | OPHTHALMIC | Status: AC
  Administered 2013-06-05: 2 [drp] via OPHTHALMIC
  Filled 2013-06-05: qty 2
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
## 2013-06-07 NOTE — ED Provider Notes (Signed)
Medical screening examination/treatment/procedure(s) were performed by non-physician practitioner and as supervising physician I was immediately available for consultation/collaboration.  EKG Interpretation   None        Doug Sou, MD 06/07/13 2050

## 2013-06-08 NOTE — L&D Delivery Note (Signed)
Delivery Note At 12:35 AM a viable female was delivered via Vaginal, Spontaneous Delivery (Presentation: Right Occiput Anterior).  APGAR: 9, 9; weight: TBD.   Placenta status: Intact, Spontaneous.  Cord: 3 vessel.  Anesthesia: Epidural  Episiotomy: none Lacerations: 2nd degree Suture Repair: 2.0 vicryl Est. Blood Loss (mL): 400cc  Mom to postpartum.  Baby to Couplet care / Skin to Skin.  Upon arrival patient had just received epidural, was complete and ready to push. She pushed with good maternal effort to deliver a healthy baby girl. Baby delivered without difficulty, was noted to have good tone and place on maternal abdomen for drying and stimulation. Delayed cord clamping performed and cut by family member. Placenta delivered intact with 3V cord. Vaginal canal and perineum was inspected, 2nd degree laceration to perineum was found and repaired, hemostatic on completion. Pitocin was started and uterus massaged until bleeding slowed. Counts of sharps, instruments, and lap pads were all correct.    Saralyn PilarAlexander Kimbly Eanes, DO [facility], PGY-1 09/29/2013, 1:08 AM

## 2013-07-09 ENCOUNTER — Encounter (HOSPITAL_COMMUNITY): Payer: Self-pay | Admitting: Emergency Medicine

## 2013-07-09 ENCOUNTER — Emergency Department (INDEPENDENT_AMBULATORY_CARE_PROVIDER_SITE_OTHER): Payer: Medicaid Other

## 2013-07-09 ENCOUNTER — Emergency Department (INDEPENDENT_AMBULATORY_CARE_PROVIDER_SITE_OTHER)
Admission: EM | Admit: 2013-07-09 | Discharge: 2013-07-09 | Disposition: A | Discharge status: Home / Self Care | Payer: Medicaid Other | Source: Home / Self Care | Attending: Emergency Medicine | Admitting: Emergency Medicine

## 2013-07-09 DIAGNOSIS — S83006A Unspecified dislocation of unspecified patella, initial encounter: Secondary | ICD-10-CM

## 2013-07-09 MED ORDER — HYDROCODONE-ACETAMINOPHEN 5-325 MG PO TABS
2.0000 | ORAL_TABLET | Freq: Once | ORAL | Status: AC
  Administered 2013-07-09: 2 via ORAL

## 2013-07-09 MED ORDER — HYDROCODONE-ACETAMINOPHEN 5-325 MG PO TABS: ORAL_TABLET | ORAL | Status: DC

## 2013-07-09 MED ORDER — HYDROCODONE-ACETAMINOPHEN 5-325 MG PO TABS
ORAL_TABLET | ORAL | Status: AC
  Filled 2013-07-09: qty 2
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
## 2013-07-09 NOTE — Discharge Instructions (Signed)
Knee Dislocation  Knee dislocation is the displacement of the bones that make up the knee. These bones are the thigh bone (femur), the lower leg bones (tibia and fibula), and the kneecap (patella). Strong, fibrous tissues that connect bones to each other (ligaments) support the knee and keep the bones together. Typically, at least 2 of the 4 major ligaments of the knee are torn before a dislocation of the knee can occur.   CAUSES   Knee dislocation is the result of a force that causes an excessive extension of the knee joint (hyperextension) that is greater than the ligaments can withstand. This is often caused by a direct hit (trauma). In rare cases, it is caused by a noncontact injury, such as stepping in a hole in the ground and twisting your knee. Typically, it is associated with vehicular trauma or contact sports.  RISK FACTORS  Knee dislocations are not common. However, some people are at greater risk of these injuries, including:   People who participate in sports that involve pivoting, jumping, cutting, or changing direction (basketball, gymnastics, soccer, volleyball).   People who participate in contact sports (football, rugby, lacrosse).   People with poor leg strength and flexibility.   People born with greater looseness in their joints.  SYMPTOMS   One or more "pops" heard or felt at the time of injury.   Knee swelling within 1 to 2 hours after the injury.   Deformity of your knee.   Loss of motion in your knee.   A sensation that your knee is "giving way" or "buckling."   Numbness, weakness, discoloration, or coldness of your foot and ankle. This may occur if you also have nerve or blood vessel injury.  DIAGNOSIS  Knee dislocation is diagnosed using results of a physical exam. Usually, an X-ray exam and an MRI scan (magnetic resonance imaging) are done to see any cartilage or ligament injuries.  TREATMENT   Knee dislocations require emergency realignment of the bones (reduction). Once your  knee is realigned, it is held in position by a splint or pins drilled into the bones of your upper and lower legs and connected to metal rods outside the leg to hold your knee in position (external fixator). Often, an exam such as an ultrasound exam or angiography will be done to be sure that a major blood vessel has not been damaged. Most often, surgery to repair damaged blood vessels is done when your torn ligaments are repaired.  HOME CARE INSTRUCTIONS  The following measures can help to reduce pain and hasten the healing process:   Rest your injured joint. Do not move it. Avoid activities similar to the one that caused your injury.   Apply ice to your injured joint for 1 to 2 days after your reduction or as directed by your caregiver. Applying ice helps to reduce inflammation and pain.   Put ice in a plastic bag.   Place a towel between your skin and the bag.   Leave the ice on for 15 to 20 minutes at a time, every couple of hours while you are awake.   Elevate your leg above your heart as instructed by your caregiver.   Move your ankle and toes as instructed by your caregiver.   Take over-the-counter or prescription medicine for pain as directed by your caregiver.  SEEK IMMEDIATE MEDICAL CARE IF:   Your splint or external fixator becomes damaged.   Your pain becomes worse rather than better.   You lose feeling   in your foot, or you cannot move your ankle and toes.  MAKE SURE YOU:   Understand these instructions.   Will watch your condition.   Will get help right away if you are not doing well or get worse.  Document Released: 02/17/2001 Document Revised: 08/17/2011 Document Reviewed: 11/23/2010  [employer] Patient Information 2014 [employer]
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
## 2013-07-27 ENCOUNTER — Ambulatory Visit: Payer: Medicaid Other | Attending: Orthopaedic Surgery | Admitting: Physical Therapy

## 2013-07-27 DIAGNOSIS — IMO0001 Reserved for inherently not codable concepts without codable children: Secondary | ICD-10-CM | POA: Insufficient documentation

## 2013-07-27 DIAGNOSIS — R609 Edema, unspecified: Secondary | ICD-10-CM | POA: Insufficient documentation

## 2013-07-27 DIAGNOSIS — M25569 Pain in unspecified knee: Secondary | ICD-10-CM | POA: Insufficient documentation

## 2013-08-01 ENCOUNTER — Ambulatory Visit: Payer: Medicaid Other | Admitting: Physical Therapy

## 2013-08-03 ENCOUNTER — Ambulatory Visit: Payer: Medicaid Other | Admitting: Physical Therapy

## 2013-08-07 ENCOUNTER — Ambulatory Visit: Payer: Medicaid Other | Attending: Orthopaedic Surgery | Admitting: Physical Therapy

## 2013-08-07 DIAGNOSIS — M25569 Pain in unspecified knee: Secondary | ICD-10-CM | POA: Insufficient documentation

## 2013-08-07 DIAGNOSIS — R609 Edema, unspecified: Secondary | ICD-10-CM | POA: Insufficient documentation

## 2013-08-07 DIAGNOSIS — IMO0001 Reserved for inherently not codable concepts without codable children: Secondary | ICD-10-CM | POA: Insufficient documentation

## 2013-08-10 ENCOUNTER — Ambulatory Visit: Payer: Medicaid Other | Admitting: Physical Therapy

## 2013-08-10 DIAGNOSIS — IMO0001 Reserved for inherently not codable concepts without codable children: Secondary | ICD-10-CM | POA: Diagnosis not present

## 2013-08-14 ENCOUNTER — Ambulatory Visit: Payer: Medicaid Other | Admitting: Physical Therapy

## 2013-08-17 ENCOUNTER — Ambulatory Visit: Payer: Medicaid Other | Admitting: Physical Therapy

## 2013-09-28 ENCOUNTER — Encounter (HOSPITAL_COMMUNITY): Payer: Self-pay | Admitting: Emergency Medicine

## 2013-09-28 ENCOUNTER — Encounter (HOSPITAL_COMMUNITY): Payer: Medicaid Other | Admitting: Anesthesiology

## 2013-09-28 ENCOUNTER — Inpatient Hospital Stay (HOSPITAL_COMMUNITY): Payer: Medicaid Other

## 2013-09-28 ENCOUNTER — Inpatient Hospital Stay (HOSPITAL_COMMUNITY)
Admission: AD | Admit: 2013-09-28 | Discharge: 2013-09-30 | DRG: 775 | Disposition: A | Discharge status: Home / Self Care | Payer: Medicaid Other | Source: Ambulatory Visit | Attending: Obstetrics & Gynecology | Admitting: Obstetrics & Gynecology

## 2013-09-28 ENCOUNTER — Inpatient Hospital Stay (HOSPITAL_COMMUNITY): Payer: Medicaid Other | Admitting: Anesthesiology

## 2013-09-28 DIAGNOSIS — R109 Unspecified abdominal pain: Secondary | ICD-10-CM

## 2013-09-28 DIAGNOSIS — J45909 Unspecified asthma, uncomplicated: Secondary | ICD-10-CM | POA: Diagnosis present

## 2013-09-28 DIAGNOSIS — D649 Anemia, unspecified: Secondary | ICD-10-CM | POA: Diagnosis present

## 2013-09-28 DIAGNOSIS — O0930 Supervision of pregnancy with insufficient antenatal care, unspecified trimester: Secondary | ICD-10-CM

## 2013-09-28 DIAGNOSIS — O99214 Obesity complicating childbirth: Secondary | ICD-10-CM

## 2013-09-28 DIAGNOSIS — Z68.41 Body mass index (BMI) pediatric, 5th percentile to less than 85th percentile for age: Secondary | ICD-10-CM

## 2013-09-28 DIAGNOSIS — IMO0001 Reserved for inherently not codable concepts without codable children: Secondary | ICD-10-CM

## 2013-09-28 DIAGNOSIS — O9902 Anemia complicating childbirth: Secondary | ICD-10-CM | POA: Diagnosis present

## 2013-09-28 DIAGNOSIS — Z833 Family history of diabetes mellitus: Secondary | ICD-10-CM

## 2013-09-28 DIAGNOSIS — Z8249 Family history of ischemic heart disease and other diseases of the circulatory system: Secondary | ICD-10-CM

## 2013-09-28 DIAGNOSIS — E669 Obesity, unspecified: Secondary | ICD-10-CM | POA: Diagnosis present

## 2013-09-28 LAB — CBC
HCT: 31.3 % — ABNORMAL LOW (ref 36.0–46.0)
Hemoglobin: 9.7 g/dL — ABNORMAL LOW (ref 12.0–15.0)
MCH: 24.4 pg — ABNORMAL LOW (ref 26.0–34.0)
MCHC: 31 g/dL (ref 30.0–36.0)
MCV: 78.6 fL (ref 78.0–100.0)
Platelets: 169 10*3/uL (ref 150–400)
RBC: 3.98 MIL/uL (ref 3.87–5.11)
RDW: 15 % (ref 11.5–15.5)
WBC: 7.3 10*3/uL (ref 4.0–10.5)

## 2013-09-28 LAB — DIFFERENTIAL
Basophils Absolute: 0 10*3/uL (ref 0.0–0.1)
Basophils Relative: 0 % (ref 0–1)
Eosinophils Absolute: 0 10*3/uL (ref 0.0–0.7)
Eosinophils Relative: 0 % (ref 0–5)
LYMPHS ABS: 1.4 10*3/uL (ref 0.7–4.0)
LYMPHS PCT: 20 % (ref 12–46)
Monocytes Absolute: 0.7 10*3/uL (ref 0.1–1.0)
Monocytes Relative: 10 % (ref 3–12)
NEUTROS PCT: 71 % (ref 43–77)
Neutro Abs: 5.2 10*3/uL (ref 1.7–7.7)

## 2013-09-28 LAB — URINE MICROSCOPIC-ADD ON

## 2013-09-28 LAB — URINALYSIS, ROUTINE W REFLEX MICROSCOPIC
Bilirubin Urine: NEGATIVE
GLUCOSE, UA: NEGATIVE mg/dL
HGB URINE DIPSTICK: NEGATIVE
Ketones, ur: NEGATIVE mg/dL
Nitrite: NEGATIVE
PROTEIN: NEGATIVE mg/dL
Specific Gravity, Urine: 1.01 (ref 1.005–1.030)
Urobilinogen, UA: 0.2 mg/dL (ref 0.0–1.0)
pH: 6.5 (ref 5.0–8.0)

## 2013-09-28 LAB — OB RESULTS CONSOLE HIV ANTIBODY (ROUTINE TESTING): HIV: NONREACTIVE

## 2013-09-28 LAB — TYPE AND SCREEN
ABO/RH(D): B POS
Antibody Screen: NEGATIVE

## 2013-09-28 LAB — RAPID HIV SCREEN (WH-MAU): Rapid HIV Screen: NONREACTIVE

## 2013-09-28 MED ORDER — DIPHENHYDRAMINE HCL 50 MG/ML IJ SOLN: 12.5000 mg | INTRAMUSCULAR | Status: DC | PRN

## 2013-09-28 MED ORDER — CITRIC ACID-SODIUM CITRATE 334-500 MG/5ML PO SOLN: 30.0000 mL | ORAL | Status: DC | PRN

## 2013-09-28 MED ORDER — IBUPROFEN 600 MG PO TABS
600.0000 mg | ORAL_TABLET | Freq: Four times a day (QID) | ORAL | Status: DC | PRN
  Administered 2013-09-29: 600 mg via ORAL
  Filled 2013-09-28: qty 1

## 2013-09-28 MED ORDER — ONDANSETRON HCL 4 MG/2ML IJ SOLN: 4.0000 mg | Freq: Four times a day (QID) | INTRAMUSCULAR | Status: DC | PRN

## 2013-09-28 MED ORDER — OXYTOCIN BOLUS FROM INFUSION
500.0000 mL | INTRAVENOUS | Status: DC
  Administered 2013-09-29: 500 mL via INTRAVENOUS

## 2013-09-28 MED ORDER — LIDOCAINE HCL (PF) 1 % IJ SOLN
30.0000 mL | INTRAMUSCULAR | Status: DC | PRN
  Filled 2013-09-28: qty 30

## 2013-09-28 MED ORDER — LACTATED RINGERS IV SOLN: 500.0000 mL | INTRAVENOUS | Status: DC | PRN

## 2013-09-28 MED ORDER — ACETAMINOPHEN 325 MG PO TABS: 650.0000 mg | ORAL_TABLET | ORAL | Status: DC | PRN

## 2013-09-28 MED ORDER — EPHEDRINE 5 MG/ML INJ
10.0000 mg | INTRAVENOUS | Status: DC | PRN
  Filled 2013-09-28: qty 4
  Filled 2013-09-28: qty 2

## 2013-09-28 MED ORDER — LACTATED RINGERS IV SOLN
INTRAVENOUS | Status: DC
  Administered 2013-09-28: 22:00:00 via INTRAVENOUS

## 2013-09-28 MED ORDER — PHENYLEPHRINE 40 MCG/ML (10ML) SYRINGE FOR IV PUSH (FOR BLOOD PRESSURE SUPPORT)
80.0000 ug | PREFILLED_SYRINGE | INTRAVENOUS | Status: DC | PRN
  Filled 2013-09-28: qty 2
  Filled 2013-09-28: qty 10

## 2013-09-28 MED ORDER — OXYTOCIN 40 UNITS IN LACTATED RINGERS INFUSION - SIMPLE MED
62.5000 mL/h | INTRAVENOUS | Status: DC
  Filled 2013-09-28: qty 1000

## 2013-09-28 MED ORDER — PENICILLIN G POTASSIUM 5000000 UNITS IJ SOLR
5.0000 10*6.[IU] | Freq: Once | INTRAVENOUS | Status: AC
  Administered 2013-09-28: 5 10*6.[IU] via INTRAVENOUS
  Filled 2013-09-28: qty 5

## 2013-09-28 MED ORDER — IBUPROFEN 600 MG PO TABS: 600.0000 mg | ORAL_TABLET | Freq: Four times a day (QID) | ORAL | Status: DC | PRN

## 2013-09-28 MED ORDER — OXYTOCIN BOLUS FROM INFUSION: 500.0000 mL | INTRAVENOUS | Status: DC

## 2013-09-28 MED ORDER — SODIUM CHLORIDE 0.9 % IV BOLUS (SEPSIS)
1000.0000 mL | Freq: Once | INTRAVENOUS | Status: AC
  Administered 2013-09-28: 1000 mL via INTRAVENOUS

## 2013-09-28 MED ORDER — FENTANYL CITRATE 0.05 MG/ML IJ SOLN: 50.0000 ug | INTRAMUSCULAR | Status: DC | PRN

## 2013-09-28 MED ORDER — OXYCODONE-ACETAMINOPHEN 5-325 MG PO TABS: 1.0000 | ORAL_TABLET | ORAL | Status: DC | PRN

## 2013-09-28 MED ORDER — FENTANYL 2.5 MCG/ML BUPIVACAINE 1/10 % EPIDURAL INFUSION (WH - ANES)
14.0000 mL/h | INTRAMUSCULAR | Status: DC | PRN
Start: 2013-09-28 — End: 2013-09-29
  Administered 2013-09-29: 14 mL/h via EPIDURAL
  Filled 2013-09-28: qty 125

## 2013-09-28 MED ORDER — OXYTOCIN 40 UNITS IN LACTATED RINGERS INFUSION - SIMPLE MED
62.5000 mL/h | INTRAVENOUS | Status: DC
Start: 2013-09-28 — End: 2013-09-29
  Filled 2013-09-28: qty 1000

## 2013-09-28 MED ORDER — CITRIC ACID-SODIUM CITRATE 334-500 MG/5ML PO SOLN
30.0000 mL | Freq: Once | ORAL | Status: AC
  Administered 2013-09-28: 30 mL via ORAL
  Filled 2013-09-28: qty 15

## 2013-09-28 MED ORDER — LACTATED RINGERS IV SOLN: 500.0000 mL | Freq: Once | INTRAVENOUS | Status: AC

## 2013-09-28 MED ORDER — LACTATED RINGERS IV SOLN: INTRAVENOUS | Status: DC

## 2013-09-28 MED ORDER — PHENYLEPHRINE 40 MCG/ML (10ML) SYRINGE FOR IV PUSH (FOR BLOOD PRESSURE SUPPORT)
80.0000 ug | PREFILLED_SYRINGE | INTRAVENOUS | Status: DC | PRN
  Filled 2013-09-28: qty 2

## 2013-09-28 MED ORDER — LACTATED RINGERS IV SOLN
500.0000 mL | INTRAVENOUS | Status: DC | PRN
  Administered 2013-09-28: 1000 mL via INTRAVENOUS

## 2013-09-28 MED ORDER — EPHEDRINE 5 MG/ML INJ
10.0000 mg | INTRAVENOUS | Status: DC | PRN
  Filled 2013-09-28: qty 2

## 2013-09-28 MED ORDER — PENICILLIN G POTASSIUM 5000000 UNITS IJ SOLR
2.5000 10*6.[IU] | INTRAMUSCULAR | Status: DC
  Filled 2013-09-28 (×4): qty 2.5

## 2013-09-28 MED ORDER — FLEET ENEMA 7-19 GM/118ML RE ENEM: 1.0000 | ENEMA | RECTAL | Status: DC | PRN

## 2013-09-28 NOTE — ED Notes (Signed)
They are paging the OB

## 2013-09-28 NOTE — H&P (Signed)
Author: [name], MD Service: Obstetrics/Gynecology Author Type: Physician    Filed: 09/28/2013  9:32 PM Note Time: 09/28/2013  9:19 PM Status: Signed    Editor: [name], MD (Physician)        Maria Bruce is a 23 y.o. female presenting for lower abdominal pain, new diagnosis of pregnancy. She received no prenatal care. She stopped depo provera in 05/2013 because of transportation problems.  Maternal Medical History:   Reason for admission: Contractions.  Nausea.  Contractions: Onset was 2 days ago.   Frequency: irregular.    Fetal activity: Perceived fetal activity is normal.    Prenatal complications: no prenatal complications No prenatal care      OB History     Grav  Para  Term  Preterm  Abortions  TAB  SAB  Ect  Mult  Living     3  2  2   0  0  0  0  0  0  2         Past Medical History   Diagnosis  Date   .  Asthma         as a child      Past Surgical History   Procedure  Laterality  Date   .  No past surgeries        Family History: family history includes Diabetes in her paternal grandmother; Heart attack in her mother; Hypertension in her paternal grandmother. Social History:  reports that she has never smoked. She has never used smokeless tobacco. She reports that she does not drink alcohol or use illicit drugs.     Prenatal Transfer Tool   Maternal Diabetes: No Genetic Screening: too late Maternal Ultrasounds/Referrals: none Fetal Ultrasounds or other Referrals:  None Maternal Substance Abuse:  No Significant Maternal Medications:  None Significant Maternal Lab Results:  None Other Comments:  no prenatal care  Review of Systems  Constitutional: Negative.   Respiratory: Negative.   Gastrointestinal: Positive for abdominal pain. Negative for nausea.    Dilation: 5 Effacement (%): 80 Station: -3 Exam by:: Dr. Debroah LoopArnold Blood pressure 128/70, pulse 92, temperature 98 F (36.7 C), temperature source Oral, resp. rate 18, height 5\' 4"   (1.626 m), weight 215 lb (97.523 kg), last menstrual period 06/29/2013, SpO2 100.00%, not currently breastfeeding. Maternal Exam:  Uterine Assessment: Contraction strength is mild.  Contraction frequency is irregular.   Abdomen: Fundal height is 34 cm.   Estimated fetal weight is 6 lb.   Fetal presentation: vertex  Introitus: Normal vulva. Normal vagina.  Vagina is negative for discharge.  Pelvis: adequate for delivery.   Cervix: Cervix evaluated by digital exam.     Physical Exam  Vitals reviewed. Constitutional: She is oriented to person, place, and time. She appears well-developed. No distress.  Neck: Normal range of motion.  Cardiovascular: Normal rate.   Respiratory: Effort normal. No respiratory distress.  GI: There is no tenderness.  Genitourinary: Vagina normal. No vaginal discharge found.  Neurological: She is alert and oriented to person, place, and time.  Skin: Skin is warm and dry.  Psychiatric: She has a normal mood and affect. Her behavior is normal.    Prenatal labs: ABO, Rh:  B pos Antibody:  neg Rubella:   RPR:     HBsAg:     HIV:     GBS:     Assessment/Plan: Advanced cervical dilation and suspect labor possible term but will do US and transfer to L&D. Prenatal labs.  [name] 09/28/2013, 9:19 PM   Besided u/s shows ~ 40 week, EFW 8#.  Proceed with expectant management.  GBS pending

## 2013-09-28 NOTE — ED Notes (Signed)
Pt states pregnant and LMP 04/2013.  No prenatal care.  Pt has intermittent lower abdominal and lower back pain.  Pt states same feeling with labor and last 2 pregnancies.  Pt has pressure and reports yellow vaginal discharge.  G-3 P-2 A-0.

## 2013-09-28 NOTE — ED Provider Notes (Signed)
CSN: [id number]     Arrival date & time 09/28/13  1707 History   First MD Initiated Contact with Patient 09/28/13 1730     Chief Complaint  Patient presents with  . Abdominal Pain  . Pregnant >6 months      (Consider location/radiation/quality/duration/timing/severity/associated sxs/prior Treatment) HPI  This is a 23 y.o. female [id number], about [redacted] weeks gestation by LMP with PMH of asthma, presenting with abdominal pain. Onset 3 days ago. Located lower abdomen.  Throbbing. Radiates to back.  Waxes and wanes.  Positive for yellow vaginal discharge (started last week). Negative for nausea, vomiting.  Past Medical History  Diagnosis Date  . Asthma     as a child   Past Surgical History  Procedure Laterality Date  . No past surgeries     Family History  Problem Relation Age of Onset  . Hypertension Paternal Grandmother   . Diabetes Paternal Grandmother   . Heart attack Mother    History  Substance Use Topics  . Smoking status: Never Smoker   . Smokeless tobacco: Never Used  . Alcohol Use: No   OB History   Grav Para Term Preterm Abortions TAB SAB Ect Mult Living   2 2 2  0 0 0 0 0 0 2     Review of Systems  Constitutional: Negative for fever and chills.  HENT: Negative for facial swelling.   Eyes: Negative for photophobia and pain.  Respiratory: Negative for cough and shortness of breath.   Cardiovascular: Negative for chest pain and leg swelling.  Gastrointestinal: Positive for abdominal pain. Negative for nausea and vomiting.  Genitourinary: Positive for vaginal discharge. Negative for dysuria.  Musculoskeletal: Negative for arthralgias.  Skin: Negative for rash and wound.  Neurological: Negative for seizures.  Hematological: Negative for adenopathy.      Allergies  Review of patient's allergies indicates no known allergies.  Home Medications   Prior to Admission medications   Medication Sig Start Date End Date Taking? Authorizing Provider   HYDROcodone-acetaminophen (NORCO/VICODIN) 5-325 MG per tablet 1 to 2 tabs every 4 to 6 hours as needed for pain. 07/09/13   Reuben Likesavid C Keller, MD   BP 115/68  Pulse 111  Temp(Src) 98.3 F (36.8 C) (Oral)  Resp 18  SpO2 94% Physical Exam  Constitutional: She is oriented to person, place, and time. She appears well-developed and well-nourished. No distress.  HENT:  Head: Normocephalic and atraumatic.  Mouth/Throat: No oropharyngeal exudate.  Eyes: Conjunctivae are normal. Pupils are equal, round, and reactive to light. No scleral icterus.  Neck: Normal range of motion. No tracheal deviation present. No thyromegaly present.  Cardiovascular: Normal rate, regular rhythm and normal heart sounds.  Exam reveals no gallop and no friction rub.   No murmur heard. Pulmonary/Chest: Effort normal and breath sounds normal. No stridor. No respiratory distress. She has no wheezes. She has no rales. She exhibits no tenderness.  Abdominal: Soft. She exhibits no distension. There is no tenderness. There is no rebound and no guarding.  Gravid  Musculoskeletal: Normal range of motion. She exhibits no edema.  Neurological: She is alert and oriented to person, place, and time.  Skin: Skin is warm and dry. She is not diaphoretic.    ED Course  Procedures (including critical care time)  MDM   Final diagnoses:  None    This is a 23 y.o. female [id number], about [redacted] weeks gestation by LMP with PMH of asthma, presenting with abdominal pain. Onset 3 days ago. Located lower  abdomen.  Throbbing. Radiates to back.  Waxes and wanes.  Positive for yellow vaginal discharge (started last week). Negative for nausea, vomiting.  Examination reveals gravid abdomen, without tenderness to palpation. Rapid response OB/GYN nurse is at bedside and has evaluated the patient. The patient is having contractions on tocometry.  [facility]. She is medically cleared for this transport. We'll continue to monitor  until she is transported.  Patient is being transported in stable condition.  I have discussed case and care has been guided by my attending physician, Dr. Gwendolyn GrantWalden.   Loma BostonStirling Nyashia Raney, MD 09/29/13 [phone]

## 2013-09-28 NOTE — MAU Note (Signed)
No prenatal care. Lower abdominal pain, intermittent (maybe 6 times per hour) x 2 days. Denies vaginal bleeding or LOF. Yellow vaginal discharge x 1 week, no vaginal irritation. Positive fetal movement. Denies dysuria.

## 2013-09-28 NOTE — MAU Note (Signed)
Pt complaining of sudden onset chest pain. Vital signs checked. Cathie BeamsFran Cresenzo-Dishmon CNM on unit & notified, at bedside to assess.

## 2013-09-28 NOTE — MAU Provider Note (Signed)
Maria Bruce is a 23 y.o. female presenting for lower abdominal pain, new diagnosis of pregnancy. She received no prenatal care. She stopped depo provera in 05/2013 because of transportation problems.  Maternal Medical History:  Reason for admission: Contractions.  Nausea.  Contractions: Onset was 2 days ago.   Frequency: irregular.    Fetal activity: Perceived fetal activity is normal.    Prenatal complications: no prenatal complications No prenatal care    OB History   Grav Para Term Preterm Abortions TAB SAB Ect Mult Living   3 2 2  0 0 0 0 0 0 2     Past Medical History  Diagnosis Date  . Asthma     as a child   Past Surgical History  Procedure Laterality Date  . No past surgeries     Family History: family history includes Diabetes in her paternal grandmother; Heart attack in her mother; Hypertension in her paternal grandmother. Social History:  reports that she has never smoked. She has never used smokeless tobacco. She reports that she does not drink alcohol or use illicit drugs.   Prenatal Transfer Tool  Maternal Diabetes: No Genetic Screening: too late Maternal Ultrasounds/Referrals: none Fetal Ultrasounds or other Referrals:  None Maternal Substance Abuse:  No Significant Maternal Medications:  None Significant Maternal Lab Results:  None Other Comments:  no prenatal care  Review of Systems  Constitutional: Negative.   Respiratory: Negative.   Gastrointestinal: Positive for abdominal pain. Negative for nausea.    Dilation: 5 Effacement (%): 80 Station: -3 Exam by:: Dr. Debroah Bruce Blood pressure 128/70, pulse 92, temperature 98 F (36.7 C), temperature source Oral, resp. rate 18, height 5\' 4"  (1.626 m), weight 215 lb (97.523 kg), last menstrual period 06/29/2013, SpO2 100.00%, not currently breastfeeding. Maternal Exam:  Uterine Assessment: Contraction strength is mild.  Contraction frequency is irregular.   Abdomen: Fundal height is 34 cm.    Estimated fetal weight is 6 lb.   Fetal presentation: vertex  Introitus: Normal vulva. Normal vagina.  Vagina is negative for discharge.  Pelvis: adequate for delivery.   Cervix: Cervix evaluated by digital exam.     Physical Exam  Vitals reviewed. Constitutional: She is oriented to person, place, and time. She appears well-developed. No distress.  Neck: Normal range of motion.  Cardiovascular: Normal rate.   Respiratory: Effort normal. No respiratory distress.  GI: There is no tenderness.  Genitourinary: Vagina normal. No vaginal discharge found.  Neurological: She is alert and oriented to person, place, and time.  Skin: Skin is warm and dry.  Psychiatric: She has a normal mood and affect. Her behavior is normal.    Prenatal labs: ABO, Rh:  B pos Antibody:  neg Rubella:   RPR:    HBsAg:    HIV:    GBS:     Assessment/Plan: Advanced cervical dilation and suspect labor possible term but will do US and transfer to L&D. Prenatal labs.   Maria Bruce 09/28/2013, 9:19 PM

## 2013-09-28 NOTE — Progress Notes (Signed)
[facility] Pt to be transferred to MAU for further evaluation.

## 2013-09-28 NOTE — Anesthesia Preprocedure Evaluation (Addendum)
Anesthesia Evaluation  Patient identified by MRN, date of birth, ID band Patient awake    Reviewed: Allergy & Precautions, H&P , Patient's Chart, lab work & pertinent test results  Airway Mallampati: II TM Distance: >3 FB Neck ROM: full    Dental  (+) Teeth Intact   Pulmonary asthma (no inhaler) ,  breath sounds clear to auscultation        Cardiovascular Rhythm:regular Rate:Normal     Neuro/Psych    GI/Hepatic   Endo/Other  Morbid obesity  Renal/GU      Musculoskeletal   Abdominal   Peds  Hematology  (+) anemia ,   Anesthesia Other Findings       Reproductive/Obstetrics (+) Pregnancy                          Anesthesia Physical  Anesthesia Plan  ASA: II  Anesthesia Plan: Epidural   Post-op Pain Management:    Induction:   Airway Management Planned:   Additional Equipment:   Intra-op Plan:   Post-operative Plan:   Informed Consent: I have reviewed the patients History and Physical, chart, labs and discussed the procedure including the risks, benefits and alternatives for the proposed anesthesia with the patient or authorized representative who has indicated his/her understanding and acceptance.   Dental Advisory Given  Plan Discussed with:   Anesthesia Plan Comments: (Labs checked- platelets confirmed with RN in room. Fetal heart tracing, per RN, reported to be stable enough for sitting procedure. Discussed epidural, and patient consents to the procedure:  included risk of possible headache,backache, failed block, allergic reaction, and nerve injury. This patient was asked if she had any questions or concerns before the procedure started. )        Anesthesia Quick Evaluation

## 2013-09-28 NOTE — Progress Notes (Signed)
OB Rapid Response: pt presents reporting no prenatal care with pregnancy. Spont vag 03/2012 and had taken Depoprovera shots since and does not know LMP. Thinks Conception occurred early Oct 2014. Presents today with >24 low abd pain and cramping with some yellow discharge. Denies ROM or vag bleeding. Reports feeling baby active. FH reassuring at 145bpm with spont accels, no decels. Contractions appearing on ext monitor.

## 2013-09-29 ENCOUNTER — Encounter (HOSPITAL_COMMUNITY): Payer: Self-pay | Admitting: General Practice

## 2013-09-29 DIAGNOSIS — O0930 Supervision of pregnancy with insufficient antenatal care, unspecified trimester: Secondary | ICD-10-CM

## 2013-09-29 DIAGNOSIS — Z833 Family history of diabetes mellitus: Secondary | ICD-10-CM

## 2013-09-29 LAB — RPR

## 2013-09-29 LAB — RUBELLA SCREEN: RUBELLA: 5.27 {index} — AB (ref <0.90)

## 2013-09-29 LAB — CBC
HEMATOCRIT: 27.9 % — AB (ref 36.0–46.0)
HEMOGLOBIN: 8.8 g/dL — AB (ref 12.0–15.0)
MCH: 24.7 pg — ABNORMAL LOW (ref 26.0–34.0)
MCHC: 31.5 g/dL (ref 30.0–36.0)
MCV: 78.4 fL (ref 78.0–100.0)
Platelets: 167 10*3/uL (ref 150–400)
RBC: 3.56 MIL/uL — AB (ref 3.87–5.11)
RDW: 14.8 % (ref 11.5–15.5)
WBC: 12.3 10*3/uL — ABNORMAL HIGH (ref 4.0–10.5)

## 2013-09-29 LAB — HEPATITIS B SURFACE ANTIGEN: Hepatitis B Surface Ag: NEGATIVE

## 2013-09-29 MED ORDER — IBUPROFEN 600 MG PO TABS
600.0000 mg | ORAL_TABLET | Freq: Four times a day (QID) | ORAL | Status: DC
  Administered 2013-09-29 – 2013-09-30 (×5): 600 mg via ORAL
  Filled 2013-09-29 (×5): qty 1

## 2013-09-29 MED ORDER — ONDANSETRON HCL 4 MG PO TABS
4.0000 mg | ORAL_TABLET | ORAL | Status: DC | PRN
  Administered 2013-09-29: 4 mg via ORAL
  Filled 2013-09-29: qty 1

## 2013-09-29 MED ORDER — OXYTOCIN 40 UNITS IN LACTATED RINGERS INFUSION - SIMPLE MED: 999.0000 mL/h | INTRAVENOUS | Status: DC

## 2013-09-29 MED ORDER — METHYLERGONOVINE MALEATE 0.2 MG/ML IJ SOLN
0.2000 mg | Freq: Once | INTRAMUSCULAR | Status: AC
  Administered 2013-09-29: 0.2 mg via INTRAMUSCULAR

## 2013-09-29 MED ORDER — SENNOSIDES-DOCUSATE SODIUM 8.6-50 MG PO TABS
2.0000 | ORAL_TABLET | ORAL | Status: DC
  Administered 2013-09-29: 2 via ORAL
  Filled 2013-09-29: qty 2

## 2013-09-29 MED ORDER — SODIUM BICARBONATE 8.4 % IV SOLN
INTRAVENOUS | Status: DC | PRN
  Administered 2013-09-29: 5 mL via EPIDURAL

## 2013-09-29 MED ORDER — OXYTOCIN 40 UNITS IN LACTATED RINGERS INFUSION - SIMPLE MED
1000.0000 mL/h | INTRAVENOUS | Status: DC
  Administered 2013-09-29: 1000 mL/h via INTRAVENOUS

## 2013-09-29 MED ORDER — LANOLIN HYDROUS EX OINT: TOPICAL_OINTMENT | CUTANEOUS | Status: DC | PRN

## 2013-09-29 MED ORDER — OXYCODONE-ACETAMINOPHEN 5-325 MG PO TABS
1.0000 | ORAL_TABLET | ORAL | Status: DC | PRN
  Administered 2013-09-29 (×3): 1 via ORAL
  Filled 2013-09-29 (×3): qty 1

## 2013-09-29 MED ORDER — TETANUS-DIPHTH-ACELL PERTUSSIS 5-2.5-18.5 LF-MCG/0.5 IM SUSP
0.5000 mL | Freq: Once | INTRAMUSCULAR | Status: AC
  Administered 2013-09-30: 0.5 mL via INTRAMUSCULAR
  Filled 2013-09-29: qty 0.5

## 2013-09-29 MED ORDER — ONDANSETRON HCL 4 MG/2ML IJ SOLN: 4.0000 mg | INTRAMUSCULAR | Status: DC | PRN

## 2013-09-29 MED ORDER — BENZOCAINE-MENTHOL 20-0.5 % EX AERO
1.0000 "application " | INHALATION_SPRAY | CUTANEOUS | Status: DC | PRN
  Administered 2013-09-29: 1 via TOPICAL
  Filled 2013-09-29: qty 56

## 2013-09-29 MED ORDER — DIBUCAINE 1 % RE OINT: 1.0000 "application " | TOPICAL_OINTMENT | RECTAL | Status: DC | PRN

## 2013-09-29 MED ORDER — SIMETHICONE 80 MG PO CHEW: 80.0000 mg | CHEWABLE_TABLET | ORAL | Status: DC | PRN

## 2013-09-29 MED ORDER — DIPHENHYDRAMINE HCL 25 MG PO CAPS: 25.0000 mg | ORAL_CAPSULE | Freq: Four times a day (QID) | ORAL | Status: DC | PRN

## 2013-09-29 MED ORDER — MISOPROSTOL 200 MCG PO TABS
800.0000 ug | ORAL_TABLET | Freq: Once | ORAL | Status: AC
  Administered 2013-09-29: 800 ug via RECTAL
  Filled 2013-09-29: qty 4

## 2013-09-29 MED ORDER — WITCH HAZEL-GLYCERIN EX PADS: 1.0000 "application " | MEDICATED_PAD | CUTANEOUS | Status: DC | PRN

## 2013-09-29 MED ORDER — PRENATAL MULTIVITAMIN CH
1.0000 | ORAL_TABLET | Freq: Every day | ORAL | Status: DC
Start: 2013-09-29 — End: 2013-09-30
  Administered 2013-09-29 – 2013-09-30 (×2): 1 via ORAL
  Filled 2013-09-29 (×2): qty 1

## 2013-09-29 MED ORDER — ZOLPIDEM TARTRATE 5 MG PO TABS: 5.0000 mg | ORAL_TABLET | Freq: Every evening | ORAL | Status: DC | PRN

## 2013-09-29 NOTE — ED Provider Notes (Signed)
I saw and evaluated the patient, reviewed the resident's note and I agree with the findings and plan.   EKG Interpretation None      21F, G3P2002 - around [redacted] wks gestation, no PNC presents with yellow vaginal discharge, lower abdominal pain. Rapid response OB nurse at bedside upon her presentation. No fevers. Toco shows contractions, [facility]  Dagmar HaitWilliam Aleathia Purdy, MD 09/29/13 [phone]

## 2013-09-29 NOTE — Clinical Social Work Maternal (Signed)
Clinical Social Work Department PSYCHOSOCIAL ASSESSMENT - MATERNAL/CHILD 09/29/2013  Patient:  Maria Bruce, Maria Bruce  Account Number:  [id number]  Admit Date:  09/28/2013  Ardine Eng Name:   ?    Clinical Social Worker:  Gerri Spore, LCSW   Date/Time:  09/29/2013 01:26 PM  Date Referred:  09/29/2013   Referral source  CN     Referred reason  Other - See comment   Other referral source:    I:  FAMILY / [place] legal guardian:  PARENT  Guardian - Name Guardian - Age Guardian - Address  [place] [address] [place] [address]  Kara Dies 21    Other household support members/support persons Name Relationship DOB   DAUGHTER 21 years old   SON 21 years old   Other support:    II  PSYCHOSOCIAL DATA Information Source:  Patient Interview  Occupational hygienist Employment:   Museum/gallery curator resources:  [employer] [place] / Grade:   Maternity Care Coordinator / Child Services Coordination / Early Interventions:  Cultural issues impacting care:    III  STRENGTHS Strengths  Adequate Resources  Home prepared for Child (including basic supplies)  Supportive family/friends   Strength comment:    IV  RISK FACTORS AND CURRENT PROBLEMS Current Problem:  YES   Risk Factor & Current Problem Patient Issue Family Issue Risk Factor / Current Problem Comment  Other - See comment Y N NPNC    V  SOCIAL WORK ASSESSMENT CSW met with pt to assess reason for [facility].  Pt told CSW that she just learned about pregnancy last month.  She denies any pregnancy symptoms, weight gain & denies feeling movement.  When pregnancy was confirmed, she was not able to establish [facility] due to lack of transportation.  She denies illegal substance use & verbalized an understanding of the hospital drug testing policy.  UDS is negative, meconium results are pending.  Pt denies any depression history  however her affect appears flat.  She told CSW that she is tired.  FOB is the father of her other 2 children but they are not together at this time.  She is hopeful that he will help care for this child.  She has some supplies for the infant but expressed a need for additional clothing.  CSW provided pt with a bundle pack.  She appears to be bonding well & appropriate at this time.  CSW discussed PP depression signs/symptoms & provided her with a Feelings After Birth brochure.  Her support system is limited to her grandmother.  CSW will continue to monitor drug screen results & make a referral if needed.      VI SOCIAL WORK PLAN Social Work Plan  No Further Intervention Required / No Barriers to Discharge   Type of pt/family education:   If child protective services report - county:   If child protective services report - date:   Information/referral to community resources comment:   Other social work plan:

## 2013-09-29 NOTE — Lactation Note (Signed)
This note was copied from the chart of Maria Bruce. Lactation Consultation Note  Patient Name: Maria Bruce ZOXWR'UToday's Date: 09/29/2013 Reason for consult: Other (Comment) (charting for exclusion)   Maternal Data Formula Feeding for Exclusion: Yes Reason for exclusion: Mother's choice to formula feed on admision  Feeding    LATCH Score/Interventions                      Lactation Tools Discussed/Used     Consult Status Consult Status: Complete    Zara ChessJoanne P Riah Kehoe 09/29/2013, 3:32 PM

## 2013-09-29 NOTE — Addendum Note (Signed)
Addendum created 09/29/13 0815 by Suella Groveoderick C Jancie Kercher, CRNA   Modules edited: Charges VN, Notes Section   Notes Section:  File: [id number]

## 2013-09-29 NOTE — Progress Notes (Signed)
UR chart review completed.  

## 2013-09-29 NOTE — Anesthesia Procedure Notes (Signed)
Epidural Patient location during procedure: OB  Preanesthetic Checklist Completed: patient identified, site marked, surgical consent, pre-op evaluation, timeout performed, IV checked, risks and benefits discussed and monitors and equipment checked  Epidural Patient position: sitting Prep: site prepped and draped and DuraPrep Patient monitoring: continuous pulse ox and blood pressure Approach: midline Injection technique: LOR air  Needle:  Needle type: Tuohy  Needle gauge: 17 G Needle length: 9 cm and 9 Needle insertion depth: 9 cm Catheter type: closed end flexible Catheter size: 19 Gauge Catheter at skin depth: 15 cm Test dose: negative  Assessment Events: blood not aspirated, injection not painful, no injection resistance, negative IV test and no paresthesia  Additional Notes 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 epi 1:200K + Xylocaine 40 mg  2nd dose, through catheter, after waiting 3 minutes...Marland Kitchen.Marland Kitchen.epi 1:200K + Xylocaine 60 mg    ( 2% Xylo charted as a single dose in Epic Meds for ease of charting; actual dosing was fractionated as above, for saftey's sake)  As each dose occurred, patient was free of IV sx; and patient exhibited no evidence of SA injection.  Patient is more comfortable after epidural dosed. Please see RN's note for documentation of vital signs,and FHR which are stable.  Patient reminded not to try to ambulate with numb legs, and that an RN must be present when she attempts to get up.

## 2013-09-29 NOTE — Anesthesia Postprocedure Evaluation (Signed)
  Anesthesia Post-op Note  Patient: Corrie DandyKimberly [place]  Procedure(s) Performed: * No procedures listed *  Patient Location: Mother/Baby  Anesthesia Type:Epidural  Level of Consciousness: awake  Airway and Oxygen Therapy: Patient Spontanous Breathing  Post-op Pain: none  Post-op Assessment: Patient's Cardiovascular Status Stable, Respiratory Function Stable, Patent Airway, No signs of Nausea or vomiting, Adequate PO intake, Pain level controlled, No headache, No backache, No residual numbness and No residual motor weakness  Post-op Vital Signs: Reviewed and stable  Last Vitals:  Filed Vitals:   09/29/13 0520  BP: 116/70  Pulse: 71  Temp: 37.1 C  Resp: 18    Complications: No apparent anesthesia complications

## 2013-09-30 MED ORDER — IBUPROFEN 600 MG PO TABS: 600.0000 mg | ORAL_TABLET | Freq: Four times a day (QID) | ORAL | Status: DC

## 2013-09-30 NOTE — Discharge Instructions (Signed)

## 2013-09-30 NOTE — Discharge Summary (Signed)
Obstetric Discharge Summary Reason for Admission: onset of labor Prenatal Procedures: none Intrapartum Procedures: spontaneous vaginal delivery Postpartum Procedures: none Complications-Operative and Postpartum: 2nd degree perineal laceration Hemoglobin  Date Value Ref Range Status  09/29/2013 8.8* 12.0 - 15.0 g/dL Final     HCT  Date Value Ref Range Status  09/29/2013 27.9* 36.0 - 46.0 % Final   Maria Bruce is a 22yo [id number] with no PNC but who was deemed to be term arrived to MAU in active labor on the night of 09/28/13. She was admitted and proceed shortly after to SVD on 09/29/13 of a female infant. By PPD#1 she is doing well and is deemed to have received the full benefit of her hospital stay. A SW visit does not document any barriers to discharge at the time but infant drug screen is still pending. The infant will most likely need to stay until 4/26 due to unknown GBS status. She is bottlefeeding and desires Nexplanon for contraception. [facility] a PP visit in 4-6 weeks.  Physical Exam:  General: alert, cooperative and no distress Heart: RRR Lungs: nl effort Lochia: appropriate Uterine Fundus: firm DVT Evaluation: No evidence of DVT seen on physical exam.  Discharge Diagnoses: Term Pregnancy-delivered  Discharge Information: Date: 09/30/2013 Activity: pelvic rest Diet: routine Medications: PNV and Ibuprofen Condition: stable Instructions: refer to practice specific booklet Discharge to: home Follow-up Information   [facility]. (You will be called soon to scheduled a postpartum appointment at the clinic in 4-6 weeks.)    Contact information:   [address] [place] [address] [phone]      Newborn Data: Live born female  Birth Weight: 7 lb 6.4 oz (3357 g) APGAR: 9, 9  Home with mother.  Maria Bruce 09/30/2013, 7:31 AM

## 2013-10-01 LAB — CULTURE, OB URINE: Special Requests: NORMAL

## 2013-10-01 LAB — CULTURE, BETA STREP (GROUP B ONLY)

## 2013-10-09 ENCOUNTER — Telehealth: Payer: Self-pay

## 2013-10-09 DIAGNOSIS — N390 Urinary tract infection, site not specified: Secondary | ICD-10-CM

## 2013-10-09 MED ORDER — SULFAMETHOXAZOLE-TMP DS 800-160 MG PO TABS
1.0000 | ORAL_TABLET | Freq: Two times a day (BID) | ORAL | Status: DC
Start: 2013-10-09 — End: 2014-10-15

## 2013-10-09 NOTE — Telephone Encounter (Signed)
Called pt. No answer. Left message stating we have sent an antibiotic to your pharmacy for UTI, you will take a tablet twice a day for 7 days, call clinic with questions or concerns.

## 2013-10-09 NOTE — Telephone Encounter (Signed)
Message copied by Louanna RawAMPBELL, Ubah Radke M on Mon Oct 09, 2013  8:31 AM ------      Message from: Adam PhenixARNOLD, JAMES G      Created: Fri Oct 06, 2013  1:55 PM       Cancel macrobid, Rx bactrim DS BID 7 days ------

## 2013-10-09 NOTE — Telephone Encounter (Signed)
Message copied by Louanna RawAMPBELL, Deshon Hsiao M on Mon Oct 09, 2013  8:30 AM ------      Message from: Adam PhenixARNOLD, JAMES G      Created: Fri Oct 06, 2013  1:54 PM       UTI Rx macrobid 100 mg po BID 7 days ------

## 2013-11-06 ENCOUNTER — Ambulatory Visit: Payer: Medicaid Other | Admitting: Family Medicine

## 2013-11-29 ENCOUNTER — Ambulatory Visit: Payer: Medicaid Other | Admitting: Advanced Practice Midwife

## 2014-01-24 ENCOUNTER — Ambulatory Visit: Payer: Medicaid Other | Admitting: Obstetrics & Gynecology

## 2014-04-09 ENCOUNTER — Encounter (HOSPITAL_COMMUNITY): Payer: Self-pay | Admitting: General Practice

## 2014-06-08 NOTE — L&D Delivery Note (Signed)
Patient is 24 y.o. [id number] 656w5d admitted in active labor, hx of moderate meconium stained membranes.   Delivery Note At 11:49 AM a viable female was delivered via Vaginal, Spontaneous Delivery (Presentation: Occiput Posterior).  APGAR: 9, 9; weight  .   Placenta status: Intact, Spontaneous.  Cord: 3 vessels with the following complications: None.    Anesthesia: Epidural  Episiotomy: None Lacerations:  1st degree Suture Repair: 3.0 vicryl rapide Est. Blood Loss (mL):  <52200mL  Mom to postpartum.  Baby to Couplet care / Skin to Skin.  Caryl AdaJazma Shahzaib Azevedo, DO 10/24/2014, 12:11 PM PGY-1, [facility]

## 2014-10-10 ENCOUNTER — Encounter (HOSPITAL_COMMUNITY): Payer: Self-pay | Admitting: *Deleted

## 2014-10-10 ENCOUNTER — Inpatient Hospital Stay (HOSPITAL_COMMUNITY)
Admission: AD | Admit: 2014-10-10 | Discharge: 2014-10-11 | Disposition: A | Discharge status: Home / Self Care | Payer: Medicaid Other | Source: Ambulatory Visit | Attending: Obstetrics and Gynecology | Admitting: Obstetrics and Gynecology

## 2014-10-10 DIAGNOSIS — R109 Unspecified abdominal pain: Secondary | ICD-10-CM | POA: Insufficient documentation

## 2014-10-10 DIAGNOSIS — O0933 Supervision of pregnancy with insufficient antenatal care, third trimester: Secondary | ICD-10-CM | POA: Insufficient documentation

## 2014-10-10 DIAGNOSIS — M545 Low back pain: Secondary | ICD-10-CM | POA: Diagnosis present

## 2014-10-10 DIAGNOSIS — Z3A37 37 weeks gestation of pregnancy: Secondary | ICD-10-CM | POA: Insufficient documentation

## 2014-10-10 NOTE — MAU Note (Signed)
Pt reports lower back pain and lower abd pressure. Also reports a lot of yellow/clear discharge.

## 2014-10-10 NOTE — MAU Note (Signed)
Pt states she has been having irregular ctx and back pain since yesterday and can no longer get comfortable. Denies LOF or VB, +FM. Pt has not received prenatal care for around three months because she moved from [place].

## 2014-10-11 LAB — URINALYSIS, ROUTINE W REFLEX MICROSCOPIC
Bilirubin Urine: NEGATIVE
Glucose, UA: NEGATIVE mg/dL
Hgb urine dipstick: NEGATIVE
Ketones, ur: NEGATIVE mg/dL
Nitrite: NEGATIVE
Protein, ur: NEGATIVE mg/dL
SPECIFIC GRAVITY, URINE: 1.015 (ref 1.005–1.030)
UROBILINOGEN UA: 0.2 mg/dL (ref 0.0–1.0)
pH: 6 (ref 5.0–8.0)

## 2014-10-11 LAB — URINE MICROSCOPIC-ADD ON

## 2014-10-15 ENCOUNTER — Other Ambulatory Visit (HOSPITAL_COMMUNITY)
Admission: RE | Admit: 2014-10-15 | Discharge: 2014-10-15 | Disposition: A | Discharge status: Home / Self Care | Payer: Medicaid Other | Source: Ambulatory Visit | Attending: Obstetrics and Gynecology | Admitting: Obstetrics and Gynecology

## 2014-10-15 ENCOUNTER — Encounter: Payer: Self-pay | Admitting: Obstetrics and Gynecology

## 2014-10-15 ENCOUNTER — Ambulatory Visit (INDEPENDENT_AMBULATORY_CARE_PROVIDER_SITE_OTHER): Payer: Medicaid Other | Admitting: Obstetrics and Gynecology

## 2014-10-15 VITALS — BP 117/56 | HR 95 | Temp 98.4°F | Wt 215.9 lb

## 2014-10-15 DIAGNOSIS — Z113 Encounter for screening for infections with a predominantly sexual mode of transmission: Secondary | ICD-10-CM | POA: Insufficient documentation

## 2014-10-15 DIAGNOSIS — Z3493 Encounter for supervision of normal pregnancy, unspecified, third trimester: Secondary | ICD-10-CM | POA: Diagnosis not present

## 2014-10-15 DIAGNOSIS — O0933 Supervision of pregnancy with insufficient antenatal care, third trimester: Secondary | ICD-10-CM | POA: Diagnosis not present

## 2014-10-15 DIAGNOSIS — Z01419 Encounter for gynecological examination (general) (routine) without abnormal findings: Secondary | ICD-10-CM | POA: Insufficient documentation

## 2014-10-15 DIAGNOSIS — Z23 Encounter for immunization: Secondary | ICD-10-CM

## 2014-10-15 DIAGNOSIS — O0930 Supervision of pregnancy with insufficient antenatal care, unspecified trimester: Secondary | ICD-10-CM | POA: Insufficient documentation

## 2014-10-15 DIAGNOSIS — Z3490 Encounter for supervision of normal pregnancy, unspecified, unspecified trimester: Secondary | ICD-10-CM | POA: Insufficient documentation

## 2014-10-15 LAB — POCT URINALYSIS DIP (DEVICE)
Bilirubin Urine: NEGATIVE
GLUCOSE, UA: NEGATIVE mg/dL
NITRITE: NEGATIVE
Protein, ur: NEGATIVE mg/dL
Specific Gravity, Urine: 1.025 (ref 1.005–1.030)
UROBILINOGEN UA: 1 mg/dL (ref 0.0–1.0)
pH: 5.5 (ref 5.0–8.0)

## 2014-10-15 LAB — OB RESULTS CONSOLE GBS: GBS: NEGATIVE

## 2014-10-15 MED ORDER — TETANUS-DIPHTH-ACELL PERTUSSIS 5-2.5-18.5 LF-MCG/0.5 IM SUSP
0.5000 mL | Freq: Once | INTRAMUSCULAR | Status: AC
  Administered 2014-10-15: 0.5 mL via INTRAMUSCULAR

## 2014-10-15 NOTE — Progress Notes (Signed)
Here for first prenatal visit. C/o hands feel numb at times. Given prenatal education packets.

## 2014-10-15 NOTE — Progress Notes (Signed)
First prenatal appt. Dated by LMP - June 10 - 14. Unsure, but close. This is 4th pregnancy.  No complications with previous pregnancies.  Last baby born in 09/2013. This pregnancy had no PNC.   States that she is starting to feel contractions, every 10-20 min, but maybe due to stress. Some physiolgic discharge +FM  No other questions.   PNL today.

## 2014-10-15 NOTE — Progress Notes (Signed)
Trace ketones, mod hgb, and small leuks on UA

## 2014-10-15 NOTE — Progress Notes (Signed)
U/S 10/17/14 @ 130p with Radiology.

## 2014-10-16 LAB — PRENATAL PROFILE (SOLSTAS)
Antibody Screen: NEGATIVE
BASOS PCT: 0 % (ref 0–1)
Basophils Absolute: 0 10*3/uL (ref 0.0–0.1)
EOS PCT: 0 % (ref 0–5)
Eosinophils Absolute: 0 10*3/uL (ref 0.0–0.7)
HCT: 28.2 % — ABNORMAL LOW (ref 36.0–46.0)
HEMOGLOBIN: 8.8 g/dL — AB (ref 12.0–15.0)
HIV 1&2 Ab, 4th Generation: NONREACTIVE
Hepatitis B Surface Ag: NEGATIVE
LYMPHS ABS: 1.6 10*3/uL (ref 0.7–4.0)
Lymphocytes Relative: 22 % (ref 12–46)
MCH: 22.5 pg — AB (ref 26.0–34.0)
MCHC: 31.2 g/dL (ref 30.0–36.0)
MCV: 72.1 fL — ABNORMAL LOW (ref 78.0–100.0)
MPV: 10.3 fL (ref 8.6–12.4)
Monocytes Absolute: 0.6 10*3/uL (ref 0.1–1.0)
Monocytes Relative: 8 % (ref 3–12)
Neutro Abs: 5 10*3/uL (ref 1.7–7.7)
Neutrophils Relative %: 70 % (ref 43–77)
Platelets: 236 10*3/uL (ref 150–400)
RBC: 3.91 MIL/uL (ref 3.87–5.11)
RDW: 16.5 % — AB (ref 11.5–15.5)
RUBELLA: 5.61 {index} — AB (ref <0.90)
Rh Type: POSITIVE
WBC: 7.2 10*3/uL (ref 4.0–10.5)

## 2014-10-16 LAB — CYTOLOGY - PAP

## 2014-10-16 LAB — GLUCOSE TOLERANCE, 1 HOUR (50G) W/O FASTING: Glucose, 1 Hour GTT: 127 mg/dL (ref 70–140)

## 2014-10-17 ENCOUNTER — Ambulatory Visit (HOSPITAL_COMMUNITY)
Admission: RE | Admit: 2014-10-17 | Discharge: 2014-10-17 | Disposition: A | Discharge status: Home / Self Care | Payer: Medicaid Other | Source: Ambulatory Visit | Attending: Obstetrics and Gynecology | Admitting: Obstetrics and Gynecology

## 2014-10-17 DIAGNOSIS — Z3A38 38 weeks gestation of pregnancy: Secondary | ICD-10-CM | POA: Insufficient documentation

## 2014-10-17 DIAGNOSIS — Z36 Encounter for antenatal screening of mother: Secondary | ICD-10-CM | POA: Insufficient documentation

## 2014-10-17 DIAGNOSIS — O0933 Supervision of pregnancy with insufficient antenatal care, third trimester: Secondary | ICD-10-CM | POA: Diagnosis not present

## 2014-10-17 DIAGNOSIS — Z3689 Encounter for other specified antenatal screening: Secondary | ICD-10-CM | POA: Insufficient documentation

## 2014-10-17 DIAGNOSIS — Z3493 Encounter for supervision of normal pregnancy, unspecified, third trimester: Secondary | ICD-10-CM

## 2014-10-17 LAB — CULTURE, OB URINE

## 2014-10-17 LAB — PRESCRIPTION MONITORING PROFILE (19 PANEL)
AMPHETAMINE/METH: NEGATIVE ng/mL
Barbiturate Screen, Urine: NEGATIVE ng/mL
Benzodiazepine Screen, Urine: NEGATIVE ng/mL
Buprenorphine, Urine: NEGATIVE ng/mL
CANNABINOID SCRN UR: NEGATIVE ng/mL
CREATININE, URINE: 155.3 mg/dL (ref >20.0)
Carisoprodol, Urine: NEGATIVE ng/mL
Cocaine Metabolites: NEGATIVE ng/mL
Fentanyl, Ur: NEGATIVE ng/mL
MDMA URINE: NEGATIVE ng/mL
Meperidine, Ur: NEGATIVE ng/mL
Methadone Screen, Urine: NEGATIVE ng/mL
Methaqualone: NEGATIVE ng/mL
Nitrites, Initial: NEGATIVE ug/mL
OPIATE SCREEN, URINE: NEGATIVE ng/mL
Oxycodone Screen, Ur: NEGATIVE ng/mL
PHENCYCLIDINE, UR: NEGATIVE ng/mL
PROPOXYPHENE: NEGATIVE ng/mL
Tapentadol, urine: NEGATIVE ng/mL
Tramadol Scrn, Ur: NEGATIVE ng/mL
Zolpidem, Urine: NEGATIVE ng/mL
pH, Initial: 5.8 pH (ref 4.5–8.9)

## 2014-10-17 LAB — CULTURE, BETA STREP (GROUP B ONLY)

## 2014-10-21 ENCOUNTER — Inpatient Hospital Stay (HOSPITAL_COMMUNITY)
Admission: AD | Admit: 2014-10-21 | Discharge: 2014-10-21 | Disposition: A | Discharge status: Home / Self Care | Payer: Medicaid Other | Source: Ambulatory Visit | Attending: Obstetrics and Gynecology | Admitting: Obstetrics and Gynecology

## 2014-10-21 ENCOUNTER — Encounter (HOSPITAL_COMMUNITY): Payer: Self-pay | Admitting: *Deleted

## 2014-10-21 DIAGNOSIS — O471 False labor at or after 37 completed weeks of gestation: Secondary | ICD-10-CM | POA: Insufficient documentation

## 2014-10-21 DIAGNOSIS — Z3A39 39 weeks gestation of pregnancy: Secondary | ICD-10-CM | POA: Insufficient documentation

## 2014-10-21 MED ORDER — PROMETHAZINE HCL 25 MG/ML IJ SOLN
12.5000 mg | Freq: Once | INTRAMUSCULAR | Status: AC
  Administered 2014-10-21: 12.5 mg via INTRAMUSCULAR
  Filled 2014-10-21: qty 1

## 2014-10-21 MED ORDER — NALBUPHINE HCL 10 MG/ML IJ SOLN
10.0000 mg | Freq: Once | INTRAMUSCULAR | Status: AC
  Administered 2014-10-21: 10 mg via INTRAMUSCULAR
  Filled 2014-10-21: qty 1

## 2014-10-21 NOTE — Discharge Instructions (Signed)
Braxton Hicks Contractions °Contractions of the uterus can occur throughout pregnancy. Contractions are not always a sign that you are in labor.  °WHAT ARE BRAXTON HICKS CONTRACTIONS?  °Contractions that occur before labor are called Braxton Hicks contractions, or false labor. Toward the end of pregnancy (32-34 weeks), these contractions can develop more often and may become more forceful. This is not true labor because these contractions do not result in opening (dilatation) and thinning of the cervix. They are sometimes difficult to tell apart from true labor because these contractions can be forceful and people have different pain tolerances. You should not feel embarrassed if you go to the hospital with false labor. Sometimes, the only way to tell if you are in true labor is for your health care provider to look for changes in the cervix. °If there are no prenatal problems or other health problems associated with the pregnancy, it is completely safe to be sent home with false labor and await the onset of true labor. °HOW CAN YOU TELL THE DIFFERENCE BETWEEN TRUE AND FALSE LABOR? °False Labor °· The contractions of false labor are usually shorter and not as hard as those of true labor.   °· The contractions are usually irregular.   °· The contractions are often felt in the front of the lower abdomen and in the groin.   °· The contractions may go away when you walk around or change positions while lying down.   °· The contractions get weaker and are shorter lasting as time goes on.   °· The contractions do not usually become progressively stronger, regular, and closer together as with true labor.   °True Labor °· Contractions in true labor last 30-70 seconds, become very regular, usually become more intense, and increase in frequency.   °· The contractions do not go away with walking.   °· The discomfort is usually felt in the top of the uterus and spreads to the lower abdomen and low back.   °· True labor can be  determined by your health care provider with an exam. This will show that the cervix is dilating and getting thinner.   °WHAT TO REMEMBER °· Keep up with your usual exercises and follow other instructions given by your health care provider.   °· Take medicines as directed by your health care provider.   °· Keep your regular prenatal appointments.   °· Eat and drink lightly if you think you are going into labor.   °· If Braxton Hicks contractions are making you uncomfortable:   °¨ Change your position from lying down or resting to walking, or from walking to resting.   °¨ Sit and rest in a tub of warm water.   °¨ Drink 2-3 glasses of water. Dehydration may cause these contractions.   °¨ Do slow and deep breathing several times an hour.   °WHEN SHOULD I SEEK IMMEDIATE MEDICAL CARE? °Seek immediate medical care if: °· Your contractions become stronger, more regular, and closer together.   °· You have fluid leaking or gushing from your vagina.   °· You have a fever.   °· You pass blood-tinged mucus.   °· You have vaginal bleeding.   °· You have continuous abdominal pain.   °· You have low back pain that you never had before.   °· You feel your baby's head pushing down and causing pelvic pressure.   °· Your baby is not moving as much as it used to.   °Document Released: 05/25/2005 Document Revised: 05/30/2013 Document Reviewed: 03/06/2013 °ExitCare® Patient Information ©2015 [employer]. This information is not intended to replace advice given to you by your health care   provider. Make sure you discuss any questions you have with your health care provider. ° °

## 2014-10-21 NOTE — MAU Note (Signed)
Pt states that she has been contracting for the past couple of days but contractions have become stronger over the past few hours. Pt denies leaking of fluid and states that baby is active.

## 2014-10-21 NOTE — MAU Provider Note (Signed)
  History   G4P3003 at 39.2 days in with c/o contractions that are 10-15 min apart. GBS neg.  CSN: [id number]  Arrival date and time: 10/21/14 1952   None     Chief Complaint  Patient presents with  . Labor Eval   HPI  OB History    Gravida Para Term Preterm AB TAB SAB Ectopic Multiple Living   4 3 3  0 0 0 0 0 0 3      Past Medical History  Diagnosis Date  . Asthma     as a child    Past Surgical History  Procedure Laterality Date  . No past surgeries      Family History  Problem Relation Age of Onset  . Hypertension Paternal Grandmother   . Diabetes Paternal Grandmother   . Heart attack Mother     History  Substance Use Topics  . Smoking status: Never Smoker   . Smokeless tobacco: Never Used  . Alcohol Use: No    Allergies: No Known Allergies  Prescriptions prior to admission  Medication Sig Dispense Refill Last Dose  . Prenatal Vit-Fe Fumarate-FA (PRENATAL MULTIVITAMIN) TABS tablet Take 1 tablet by mouth daily at 12 noon.   Past Week at Unknown time    Review of Systems  Constitutional: Negative.   HENT: Negative.   Eyes: Negative.   Respiratory: Negative.   Cardiovascular: Negative.   Gastrointestinal: Positive for abdominal pain.  Genitourinary: Negative.   Musculoskeletal: Negative.   Skin: Negative.   Neurological: Negative.   Endo/Heme/Allergies: Negative.   Psychiatric/Behavioral: Negative.    Physical Exam   Blood pressure 112/59, pulse 99, temperature 98 F (36.7 C), temperature source Oral, resp. rate 18, height 5\' 4"  (1.626 m), weight 210 lb (95.255 kg), unknown if currently breastfeeding.  Physical Exam  Constitutional: She is oriented to person, place, and time. She appears well-developed and well-nourished.  HENT:  Head: Normocephalic.  Eyes: Pupils are equal, round, and reactive to light.  Neck: Normal range of motion.  Cardiovascular: Normal rate, regular rhythm, normal heart sounds and intact distal pulses.   Respiratory:  Effort normal and breath sounds normal.  GI: Soft. Bowel sounds are normal.  Genitourinary: Vagina normal and uterus normal.  Neurological: She is alert and oriented to person, place, and time. She has normal reflexes.  Skin: Skin is warm and dry.  Psychiatric: She has a normal mood and affect. Her behavior is normal. Judgment and thought content normal.    MAU Course  Procedures  MDM No cervical change qafter 1 hour. False labor  Assessment and Plan  D/c home thereaputic rest  Nyjah Schwake DARLENE 10/21/2014, 9:23 PM

## 2014-10-23 ENCOUNTER — Encounter: Payer: Self-pay | Admitting: Obstetrics & Gynecology

## 2014-10-23 ENCOUNTER — Ambulatory Visit (INDEPENDENT_AMBULATORY_CARE_PROVIDER_SITE_OTHER): Payer: Medicaid Other | Admitting: Obstetrics & Gynecology

## 2014-10-23 VITALS — BP 113/54 | HR 77 | Temp 98.7°F | Wt 217.1 lb

## 2014-10-23 DIAGNOSIS — O0933 Supervision of pregnancy with insufficient antenatal care, third trimester: Secondary | ICD-10-CM | POA: Diagnosis not present

## 2014-10-23 DIAGNOSIS — Z3493 Encounter for supervision of normal pregnancy, unspecified, third trimester: Secondary | ICD-10-CM | POA: Diagnosis not present

## 2014-10-23 LAB — POCT URINALYSIS DIP (DEVICE)
BILIRUBIN URINE: NEGATIVE
Glucose, UA: NEGATIVE mg/dL
Hgb urine dipstick: NEGATIVE
Ketones, ur: NEGATIVE mg/dL
Nitrite: NEGATIVE
Protein, ur: NEGATIVE mg/dL
Specific Gravity, Urine: 1.025 (ref 1.005–1.030)
UROBILINOGEN UA: 1 mg/dL (ref 0.0–1.0)
pH: 6.5 (ref 5.0–8.0)

## 2014-10-23 NOTE — Progress Notes (Signed)
Edema-feet  Pressure- lower abd   Pain- lower back

## 2014-10-23 NOTE — Progress Notes (Signed)
US EFW <90 %ile, labor precautions given , EDC by LMP 10/22/14

## 2014-10-24 ENCOUNTER — Encounter (HOSPITAL_COMMUNITY): Payer: Self-pay | Admitting: *Deleted

## 2014-10-24 ENCOUNTER — Inpatient Hospital Stay (HOSPITAL_COMMUNITY): Payer: Medicaid Other | Admitting: Anesthesiology

## 2014-10-24 ENCOUNTER — Inpatient Hospital Stay (HOSPITAL_COMMUNITY)
Admission: AD | Admit: 2014-10-24 | Discharge: 2014-10-26 | DRG: 775 | Disposition: A | Discharge status: Home / Self Care | Payer: Medicaid Other | Source: Ambulatory Visit | Attending: Obstetrics & Gynecology | Admitting: Obstetrics & Gynecology

## 2014-10-24 DIAGNOSIS — Z3A39 39 weeks gestation of pregnancy: Secondary | ICD-10-CM | POA: Diagnosis present

## 2014-10-24 DIAGNOSIS — IMO0001 Reserved for inherently not codable concepts without codable children: Secondary | ICD-10-CM

## 2014-10-24 DIAGNOSIS — Z3483 Encounter for supervision of other normal pregnancy, third trimester: Secondary | ICD-10-CM | POA: Diagnosis present

## 2014-10-24 LAB — TYPE AND SCREEN
ABO/RH(D): B POS
Antibody Screen: NEGATIVE

## 2014-10-24 LAB — CBC
HCT: 27.3 % — ABNORMAL LOW (ref 36.0–46.0)
Hemoglobin: 8.3 g/dL — ABNORMAL LOW (ref 12.0–15.0)
MCH: 22.7 pg — ABNORMAL LOW (ref 26.0–34.0)
MCHC: 30.4 g/dL (ref 30.0–36.0)
MCV: 74.6 fL — ABNORMAL LOW (ref 78.0–100.0)
PLATELETS: 206 10*3/uL (ref 150–400)
RBC: 3.66 MIL/uL — AB (ref 3.87–5.11)
RDW: 16.8 % — ABNORMAL HIGH (ref 11.5–15.5)
WBC: 7.5 10*3/uL (ref 4.0–10.5)

## 2014-10-24 LAB — POCT FERN TEST: POCT FERN TEST: POSITIVE

## 2014-10-24 LAB — RPR: RPR Ser Ql: NONREACTIVE

## 2014-10-24 MED ORDER — OXYCODONE-ACETAMINOPHEN 5-325 MG PO TABS: 1.0000 | ORAL_TABLET | ORAL | Status: DC | PRN

## 2014-10-24 MED ORDER — OXYTOCIN 40 UNITS IN LACTATED RINGERS INFUSION - SIMPLE MED: 62.5000 mL/h | INTRAVENOUS | Status: DC | PRN

## 2014-10-24 MED ORDER — DIBUCAINE 1 % RE OINT
1.0000 | TOPICAL_OINTMENT | RECTAL | Status: DC | PRN
Start: 2014-10-24 — End: 2014-10-26

## 2014-10-24 MED ORDER — MISOPROSTOL 200 MCG PO TABS
800.0000 ug | ORAL_TABLET | Freq: Once | ORAL | Status: AC
Start: 2014-10-24 — End: 2014-10-24
  Administered 2014-10-24: 800 ug via VAGINAL

## 2014-10-24 MED ORDER — FLEET ENEMA 7-19 GM/118ML RE ENEM: 1.0000 | ENEMA | Freq: Every day | RECTAL | Status: DC | PRN

## 2014-10-24 MED ORDER — ONDANSETRON HCL 4 MG/2ML IJ SOLN: 4.0000 mg | Freq: Four times a day (QID) | INTRAMUSCULAR | Status: DC | PRN

## 2014-10-24 MED ORDER — ACETAMINOPHEN 325 MG PO TABS: 650.0000 mg | ORAL_TABLET | ORAL | Status: DC | PRN

## 2014-10-24 MED ORDER — LANOLIN HYDROUS EX OINT: TOPICAL_OINTMENT | CUTANEOUS | Status: DC | PRN

## 2014-10-24 MED ORDER — SODIUM CHLORIDE 0.9 % IJ SOLN: 3.0000 mL | INTRAMUSCULAR | Status: DC | PRN

## 2014-10-24 MED ORDER — OXYTOCIN BOLUS FROM INFUSION: 500.0000 mL | INTRAVENOUS | Status: DC

## 2014-10-24 MED ORDER — PHENYLEPHRINE 40 MCG/ML (10ML) SYRINGE FOR IV PUSH (FOR BLOOD PRESSURE SUPPORT)
80.0000 ug | PREFILLED_SYRINGE | INTRAVENOUS | Status: DC | PRN
  Filled 2014-10-24: qty 20
  Filled 2014-10-24: qty 2

## 2014-10-24 MED ORDER — CITRIC ACID-SODIUM CITRATE 334-500 MG/5ML PO SOLN: 30.0000 mL | ORAL | Status: DC | PRN

## 2014-10-24 MED ORDER — OXYCODONE-ACETAMINOPHEN 5-325 MG PO TABS: 2.0000 | ORAL_TABLET | ORAL | Status: DC | PRN

## 2014-10-24 MED ORDER — ONDANSETRON HCL 4 MG/2ML IJ SOLN: 4.0000 mg | INTRAMUSCULAR | Status: DC | PRN

## 2014-10-24 MED ORDER — EPHEDRINE 5 MG/ML INJ
10.0000 mg | INTRAVENOUS | Status: DC | PRN
  Filled 2014-10-24: qty 2

## 2014-10-24 MED ORDER — DIPHENHYDRAMINE HCL 50 MG/ML IJ SOLN: 12.5000 mg | INTRAMUSCULAR | Status: DC | PRN

## 2014-10-24 MED ORDER — LIDOCAINE HCL (PF) 1 % IJ SOLN
INTRAMUSCULAR | Status: DC | PRN
  Administered 2014-10-24 (×2): 4 mL

## 2014-10-24 MED ORDER — SENNOSIDES-DOCUSATE SODIUM 8.6-50 MG PO TABS
2.0000 | ORAL_TABLET | ORAL | Status: DC
  Administered 2014-10-24: 2 via ORAL
  Filled 2014-10-24 (×2): qty 2

## 2014-10-24 MED ORDER — PHENYLEPHRINE 40 MCG/ML (10ML) SYRINGE FOR IV PUSH (FOR BLOOD PRESSURE SUPPORT)
80.0000 ug | PREFILLED_SYRINGE | INTRAVENOUS | Status: DC | PRN
Start: 2014-10-24 — End: 2014-10-24

## 2014-10-24 MED ORDER — SODIUM CHLORIDE 0.9 % IV SOLN: 250.0000 mL | INTRAVENOUS | Status: DC | PRN

## 2014-10-24 MED ORDER — ACETAMINOPHEN 325 MG PO TABS
650.0000 mg | ORAL_TABLET | ORAL | Status: DC | PRN
  Administered 2014-10-25: 650 mg via ORAL
  Filled 2014-10-24: qty 2

## 2014-10-24 MED ORDER — DIPHENHYDRAMINE HCL 25 MG PO CAPS
25.0000 mg | ORAL_CAPSULE | Freq: Four times a day (QID) | ORAL | Status: DC | PRN
Start: 2014-10-24 — End: 2014-10-26

## 2014-10-24 MED ORDER — LIDOCAINE HCL (PF) 1 % IJ SOLN
30.0000 mL | INTRAMUSCULAR | Status: DC | PRN
  Filled 2014-10-24: qty 30

## 2014-10-24 MED ORDER — WITCH HAZEL-GLYCERIN EX PADS: 1.0000 "application " | MEDICATED_PAD | CUTANEOUS | Status: DC | PRN

## 2014-10-24 MED ORDER — FENTANYL 2.5 MCG/ML BUPIVACAINE 1/10 % EPIDURAL INFUSION (WH - ANES): 14.0000 mL/h | INTRAMUSCULAR | Status: DC | PRN

## 2014-10-24 MED ORDER — IBUPROFEN 600 MG PO TABS
600.0000 mg | ORAL_TABLET | Freq: Four times a day (QID) | ORAL | Status: DC
  Administered 2014-10-24 – 2014-10-26 (×7): 600 mg via ORAL
  Filled 2014-10-24 (×7): qty 1

## 2014-10-24 MED ORDER — MISOPROSTOL 200 MCG PO TABS
ORAL_TABLET | ORAL | Status: AC
  Filled 2014-10-24: qty 4

## 2014-10-24 MED ORDER — ONDANSETRON HCL 4 MG PO TABS: 4.0000 mg | ORAL_TABLET | ORAL | Status: DC | PRN

## 2014-10-24 MED ORDER — PRENATAL MULTIVITAMIN CH
1.0000 | ORAL_TABLET | Freq: Every day | ORAL | Status: DC
  Administered 2014-10-25: 1 via ORAL
  Filled 2014-10-24: qty 1

## 2014-10-24 MED ORDER — BISACODYL 10 MG RE SUPP: 10.0000 mg | Freq: Every day | RECTAL | Status: DC | PRN

## 2014-10-24 MED ORDER — LACTATED RINGERS IV SOLN: INTRAVENOUS | Status: DC

## 2014-10-24 MED ORDER — OXYTOCIN 40 UNITS IN LACTATED RINGERS INFUSION - SIMPLE MED
62.5000 mL/h | INTRAVENOUS | Status: DC
  Administered 2014-10-24: 999 mL/h via INTRAVENOUS
  Filled 2014-10-24: qty 1000

## 2014-10-24 MED ORDER — BENZOCAINE-MENTHOL 20-0.5 % EX AERO: 1.0000 "application " | INHALATION_SPRAY | CUTANEOUS | Status: DC | PRN

## 2014-10-24 MED ORDER — SIMETHICONE 80 MG PO CHEW: 80.0000 mg | CHEWABLE_TABLET | ORAL | Status: DC | PRN

## 2014-10-24 MED ORDER — FENTANYL 2.5 MCG/ML BUPIVACAINE 1/10 % EPIDURAL INFUSION (WH - ANES)
14.0000 mL/h | INTRAMUSCULAR | Status: DC | PRN
  Administered 2014-10-24 (×2): 14 mL/h via EPIDURAL
  Filled 2014-10-24: qty 125

## 2014-10-24 MED ORDER — SODIUM CHLORIDE 0.9 % IJ SOLN: 3.0000 mL | Freq: Two times a day (BID) | INTRAMUSCULAR | Status: DC

## 2014-10-24 MED ORDER — ZOLPIDEM TARTRATE 5 MG PO TABS: 5.0000 mg | ORAL_TABLET | Freq: Every evening | ORAL | Status: DC | PRN

## 2014-10-24 MED ORDER — LACTATED RINGERS IV SOLN: 500.0000 mL | INTRAVENOUS | Status: DC | PRN

## 2014-10-24 NOTE — MAU Note (Signed)
States water broke at 0530, UC's started after that.

## 2014-10-24 NOTE — H&P (Signed)
LABOR ADMISSION HISTORY AND PHYSICAL  Maria Bruce is a 24 y.o. female [phone] with IUP at 6684w5d by LMP with alternate EDD of 10/26/14  presenting in active labor. She reports +FM, + contractions, +LOF, no VB, no blurry vision, headaches or peripheral edema, and RUQ pain.  She plans on pumping and bottle feeding. She request Nexplanon for birth control.  Of note, patient had late prenatal care with her first OB appointment 10/15/2014.   Dating: By LMP --->  Estimated Date of Delivery: 10/26/14  Prenatal History/Complications: Late prenatal care in third trimester Low-risk pregnancy  Past Medical History: Past Medical History  Diagnosis Date  . Asthma     as a child    Past Surgical History: Past Surgical History  Procedure Laterality Date  . No past surgeries      Obstetrical History: OB History    Gravida Para Term Preterm AB TAB SAB Ectopic Multiple Living   4 3 3  0 0 0 0 0 0 3      Social History: History   Social History  . Marital Status: Single    Spouse Name: N/A  . Number of Children: N/A  . Years of Education: N/A   Social History Main Topics  . Smoking status: Never Smoker   . Smokeless tobacco: Never Used  . Alcohol Use: No  . Drug Use: No  . Sexual Activity: Yes    Birth Control/ Protection: None   Other Topics Concern  . None   Social History Narrative    Family History: Family History  Problem Relation Age of Onset  . Hypertension Paternal Grandmother   . Diabetes Paternal Grandmother   . Heart attack Mother     Allergies: No Known Allergies  Prescriptions prior to admission  Medication Sig Dispense Refill Last Dose  . Prenatal Vit-Fe Fumarate-FA (PRENATAL MULTIVITAMIN) TABS tablet Take 1 tablet by mouth daily at 12 noon.   10/23/2014 at Unknown time    Review of Systems   All systems reviewed and negative except as stated in HPI  BP 111/62 mmHg  Pulse 76  Temp(Src) 98.1 F (36.7 C) (Oral)  Resp 18  Ht 5\' 4"  (1.626 m)   Wt 220 lb (99.791 kg)  BMI 37.74 kg/m2  LMP 01/15/2014 (Exact Date) General appearance: alert, cooperative and no distress Lungs: clear to auscultation bilaterally Heart: regular rate and rhythm Abdomen: soft, non-tender; bowel sounds normal Pelvic: 7/80/-1 Extremities: Homans sign is negative, no sign of DVT, edema Presentation: cephalic Fetal monitoringBaseline: 130 bpm, Variability: Good {> 6 bpm), Accelerations: Reactive and Decelerations: Absent Uterine activityFrequency: Every 3-5 minutes Dilation: 6 Effacement (%): 80 Station: -2 Exam by:: Sherron MondayS. Carrrera, RNC   Prenatal labs: ABO, Rh: --/--/B POS (05/18 0805) Antibody: PENDING (05/18 0805) Rubella:   Immune RPR: NON REAC (05/09 1100)  HBsAg: NEGATIVE (05/09 1100)  HIV: NONREACTIVE (05/09 1100)  GBS: Negative (05/09 0754)  GTT third trimester: 127 Genetic screening  None; too late Anatomy US WNL, estimated weight 9lb 1 oz   Prenatal Transfer Tool  Maternal Diabetes: No Genetic Screening: Declined, too late Maternal Ultrasounds/Referrals: Normal Fetal Ultrasounds or other Referrals:  None Maternal Substance Abuse:  No Significant Maternal Medications:  None Significant Maternal Lab Results: None  Results for orders placed or performed during the hospital encounter of 10/24/14 (from the past 24 hour(s))  Fern Test   Collection Time: 10/24/14  7:45 AM  Result Value Ref Range   POCT Fern Test Positive = ruptured amniotic membanes  CBC   Collection Time: 10/24/14  8:05 AM  Result Value Ref Range   WBC 7.5 4.0 - 10.5 K/uL   RBC 3.66 (L) 3.87 - 5.11 MIL/uL   Hemoglobin 8.3 (L) 12.0 - 15.0 g/dL   HCT 40.927.3 (L) 81.136.0 - 91.446.0 %   MCV 74.6 (L) 78.0 - 100.0 fL   MCH 22.7 (L) 26.0 - 34.0 pg   MCHC 30.4 30.0 - 36.0 g/dL   RDW 78.216.8 (H) 95.611.5 - 21.315.5 %   Platelets 206 150 - 400 K/uL  Type and screen   Collection Time: 10/24/14  8:05 AM  Result Value Ref Range   ABO/RH(D) B POS    Antibody Screen PENDING    Sample  Expiration 10/27/2014   Results for orders placed or performed in visit on 10/23/14 (from the past 24 hour(s))  POCT urinalysis dip (device)   Collection Time: 10/23/14  4:29 PM  Result Value Ref Range   Glucose, UA NEGATIVE NEGATIVE mg/dL   Bilirubin Urine NEGATIVE NEGATIVE   Ketones, ur NEGATIVE NEGATIVE mg/dL   Specific Gravity, Urine 1.025 1.005 - 1.030   Hgb urine dipstick NEGATIVE NEGATIVE   pH 6.5 5.0 - 8.0   Protein, ur NEGATIVE NEGATIVE mg/dL   Urobilinogen, UA 1.0 0.0 - 1.0 mg/dL   Nitrite NEGATIVE NEGATIVE   Leukocytes, UA SMALL (A) NEGATIVE    Patient Active Problem List   Diagnosis Date Noted  . Active labor at term 10/24/2014  . No prenatal care in current pregnancy 10/15/2014  . Supervision of low-risk pregnancy 10/15/2014    Assessment: Maria Bruce is a 24 y.o. [phone] at 357w5d here in active labor with SROM.   #Labor: Expectant management. Plan on NSVD.   - US with EFW of 9lb1oz #Pain: Currently with epidrual #FWB: Cat 1 #MOF: Pumping and formula #MOC: Nexplanon #Circ: Declines  Caryl AdaJazma Phelps, DO 10/24/2014, 9:17 AM PGY-1, [facility]  OB fellow attestation:  I have seen and examined this patient; I agree with above documentation in the resident's note.   Maria Bruce is a 24 y.o. [phone] here for rupture of membranes  PE: BP 113/66 mmHg  Pulse 78  Temp(Src) 98.1 F (36.7 C) (Oral)  Resp 18  Ht 5\' 4"  (1.626 m)  Wt 220 lb (99.791 kg)  BMI 37.74 kg/m2  LMP 01/15/2014 (Exact Date) Gen: calm comfortable, NAD Resp: normal effort, no distress Abd: gravid  ROS, labs, PMH reviewed  Plan: MOF: pumping and formula MOC: nexplanon ID: GBS neg FWB: cat I Labor: may need pitocin, will augment if necessary Pain: epidural currently in place  Elye Harmsen ROCIO 10/24/2014, 10:03 AM

## 2014-10-24 NOTE — Patient Instructions (Signed)

## 2014-10-24 NOTE — Anesthesia Procedure Notes (Signed)
Epidural Patient location during procedure: OB Start time: 10/24/2014 8:57 AM  Staffing Anesthesiologist: Mal AmabileFOSTER, Donavan Kerlin Performed by: anesthesiologist   Preanesthetic Checklist Completed: patient identified, site marked, surgical consent, pre-op evaluation, timeout performed, IV checked, risks and benefits discussed and monitors and equipment checked  Epidural Patient position: sitting Prep: site prepped and draped and DuraPrep Patient monitoring: continuous pulse ox and blood pressure Approach: midline Location: L3-L4 Injection technique: LOR air  Needle:  Needle type: Tuohy  Needle gauge: 17 G Needle length: 9 cm and 9 Needle insertion depth: 7 cm Catheter type: closed end flexible Catheter size: 19 Gauge Catheter at skin depth: 12 cm Test dose: negative and Other  Assessment Events: blood not aspirated, injection not painful, no injection resistance, negative IV test and no paresthesia  Additional Notes Patient identified. Risks and benefits discussed including failed block, incomplete  Pain control, post dural puncture headache, nerve damage, paralysis, blood pressure Changes, nausea, vomiting, reactions to medications-both toxic and allergic and post Partum back pain. All questions were answered. Patient expressed understanding and wished to proceed. Sterile technique was used throughout procedure. Epidural site was Dressed with sterile barrier dressing. No paresthesias, signs of intravascular injection Or signs of intrathecal spread were encountered.  Patient was more comfortable after the epidural was dosed. Please see RN's note for documentation of vital signs and FHR which are stable.

## 2014-10-24 NOTE — Anesthesia Preprocedure Evaluation (Signed)
Anesthesia Evaluation  Patient identified by MRN, date of birth, ID band Patient awake    Reviewed: Allergy & Precautions, Patient's Chart, lab work & pertinent test results  Airway Mallampati: III  TM Distance: >3 FB Neck ROM: Full    Dental no notable dental hx. (+) Teeth Intact   Pulmonary asthma ,  breath sounds clear to auscultation  Pulmonary exam normal       Cardiovascular negative cardio ROS Normal cardiovascular examRhythm:Regular Rate:Normal     Neuro/Psych negative neurological ROS  negative psych ROS   GI/Hepatic negative GI ROS, Neg liver ROS,   Endo/Other  Morbid obesity  Renal/GU negative Renal ROS  negative genitourinary   Musculoskeletal   Abdominal (+) + obese,   Peds  Hematology  (+) anemia ,   Anesthesia Other Findings   Reproductive/Obstetrics (+) Pregnancy                             Anesthesia Physical Anesthesia Plan  ASA: II  Anesthesia Plan: Epidural   Post-op Pain Management:    Induction:   Airway Management Planned: Natural Airway  Additional Equipment:   Intra-op Plan:   Post-operative Plan:   Informed Consent: I have reviewed the patients History and Physical, chart, labs and discussed the procedure including the risks, benefits and alternatives for the proposed anesthesia with the patient or authorized representative who has indicated his/her understanding and acceptance.     Plan Discussed with: Anesthesiologist  Anesthesia Plan Comments:         Anesthesia Quick Evaluation

## 2014-10-25 NOTE — Progress Notes (Signed)
CSW made referral to the [facility] for OBCM/CC4C services for added support in the home.

## 2014-10-25 NOTE — Clinical Social Work Maternal (Addendum)
CLINICAL SOCIAL WORK MATERNAL/CHILD NOTE  Patient Details  Name: Maria Bruce MRN: [id number] Date of Birth: 02-05-1991  Date:  10/25/2014  Clinical Social Worker Initiating Note:  Ivelisse Culverhouse E. Brigitte Pulse, Makena Date/ Time Initiated:  10/25/14/1230     Child's Name:  Maria Bruce   Legal Guardian:   (Parents: [place] Sullivan and Kara Dies)   Need for Interpreter:  None   Date of Referral:  10/24/14     Reason for Referral:  Late or No Prenatal Care    Referral Source:  [facility]   Address:  [address], [place], [place] [address]  Phone number:  [phone]   Household Members:  Self, Significant Other, Minor Children   Natural Supports (not living in the home):  Extended Family, Friends (MOB states her mains supports are FOB, best friend and MGM.)   Professional Supports: None   Employment: Full-time   Type of Work:  (FOB works at [employer])   Education:      Pensions consultant:  [employer]   Other Resources:  [facility]   Cultural/Religious Considerations Which May Impact Care:  None stated  Strengths:  Ability to meet basic needs , Home prepared for child , Engineer, materials  (Pediatric follow up will be at [facility])   Risk Factors/Current Problems:  [place] Concerns  (MOB reports feelings of depression during pregnancy)   Cognitive State:  Alert , Insightful , Linear Thinking    Mood/Affect:  Calm , Comfortable , Relaxed    CSW Assessment: CSW met with MOB in her first floor room/110 to complete assessment for [name].  MOB was quiet, but pleasant and welcomed CSW into the room.  She reports labor and delivery went well and that she and baby are doing well post partum.  She told CSW about her 3 children at home, ages 6 ([place]), 2 Oswaldo Milian) and 1 Andree Moro) and that her mother is currently caring for them while she is in the hospital.  She reports that all of her children have the same father Kara Dies) and that  they are in a relationship and live together.  She states he has been here with her, but went to work today.  MOB reports feeling overwhelmed initially with the realization that she was having a fourth baby, but is "okay" with it now.  She states her mother is a huge help to her and will be keeping her two older children for a couple weeks to help her adjust to her newborn.  She states her mother lives 5 minutes away.  MOB states she has baby supplies for baby at home.  CSW specifically asked about a bed for baby and MOB reports that baby will be sleeping in a crib.  CSW discussed risk of SIDS if co-bedding and MOB agrees that she will not co-bed.   CSW inquired about MOB's post partum periods after her other children, acknowledging the limited amount of time between births.  She states she thinks she did fine emotionally for the most part, but did talk with CSW about some depression she felt during the pregnancy.  She states she and [name] were having some problems on top of her feeling depressed and stressed about another baby.  She and the children moved to [place] to live with her sisters.  She states she didn't feel right about being there and decided to move back to [place] after 3 months in [place].  She explains this as the reason she did not  receive PNC also.  She had difficulty accepting the pregnancy.  She states she and [name] are doing well together and that she is glad she decided to come back home.  She states she takes her children to [facility] and will not have issues with getting them to appointments.  CSW talked in depth about PPD, especially considering depressive symptoms during pregnancy and now four children age 74 and under.  MOB admits that she feels depressed being alone in the apartment with four children.  We talked about getting outside and calling on friends and family so not to isolate herself.  CSW asked that she talk with a medical professional if she is feeling symptoms of PPD and she agreed.  CSW  suggested talking with the social worker at [facility] at any time.  CSW asked if MOB would be interested in outpatient counseling at this time due to the depression she felt during pregnancy.  She states she does not think she would be interested at this time, but maybe in a month or so, when she and the baby get more settled at home.  CSW provided MOB with a resource list of walk-in clinics as well as therapists in her area.  MOB was very Patent attorney.  CSW informed MOB of hospital drug screen policy due to [facility]  She states understanding and no concerns.  UDS is negative.  CSW will monitor MDS result.   CSW Plan/Description:  Information/Referral to [employer] , No Further Intervention Required/No Barriers to Discharge, Patient/Family Education     Alphonzo Cruise, [place] 10/25/2014, 1:15 PM

## 2014-10-25 NOTE — Progress Notes (Signed)
Post Partum Day 1  Subjective:  Maria Bruce is a 24 y.o. [phone] 7948w5d s/p SVD.  No acute events overnight.  Pt denies problems with ambulating, voiding or po intake.  She denies nausea or vomiting.  Pain is moderately controlled.  She has had flatus. She has had bowel movement.  Lochia Moderate.  Plan for birth control is Nexplanon.  Method of Feeding: Bottle.  Patient states that her right arm is hurting some from when they pulled out the IV last night. She also endorses bilateral feet swelling.   Objective: BP 116/49 mmHg  Pulse 70  Temp(Src) 98.7 F (37.1 C) (Oral)  Resp 19  Ht 5\' 4"  (1.626 m)  Wt 220 lb (99.791 kg)  BMI 37.74 kg/m2  SpO2 100%  LMP 01/15/2014 (Exact Date)  Breastfeeding? Unknown  Physical Exam:  General: alert, cooperative and no distress Lochia:normal flow Chest: CTAB Heart: RRR no m/r/g Abdomen: +BS, soft, nontender, fundus firm below umbilicus Uterine Fundus: firm, #+ below umbilicus DVT Evaluation: No evidence of DVT seen on physical exam. Extremities: trace edema   Recent Labs  10/24/14 0805  HGB 8.3*  HCT 27.3*    Assessment/Plan:  ASSESSMENT: Maria Bruce is a 24 y.o. [id number] 8248w5d ppd #1 s/p NSVD doing well.   Plan for discharge tomorrow and Contraception Nexplanon  Encourage patient to get up out of bed and when resting to elevate feet    LOS: 1 day   Caryl AdaJazma Phelps, DO 10/25/2014, 7:36 AM PGY-1, [facility]   CNM attestation Post Partum Day #1 I have seen and examined this patient and agree with above documentation in the resident's note.   Maria Bruce is a 24 y.o. [phone] s/p SVD.  Pt denies problems with ambulating, voiding or po intake. Pain is well controlled.  Plan for birth control is Nexplanon.  Method of Feeding: breast/pumping  PE:  BP 116/49 mmHg  Pulse 70  Temp(Src) 98.7 F (37.1 C) (Oral)  Resp 19  Ht 5\' 4"  (1.626 m)  Wt 99.791 kg (220 lb)  BMI 37.74 kg/m2   SpO2 100%  LMP 01/15/2014 (Exact Date)  Breastfeeding? Unknown Fundus firm  Plan for discharge: 10/26/14  Cam HaiSHAW, Aleighya, CNM 10:39 AM

## 2014-10-25 NOTE — Lactation Note (Signed)
This note was copied from the chart of Maria Bruce. Lactation Consultation Note  Mother plans to pump and bottle feed.  She is presently not expressing colostrum with the Double electric breast pump.  I taught her hand expression and she was easily able to express about 1 ml of colostrum.  This was encouraging to her.  Stated need for her to pump every 2-3 hours if she desired a good milk supply. [facility]  I informed her that it was important for her to contact them regarding the birth of the baby.  [facility] explained to her. Understanding verbalized. Aware of support groups and outpatient services.  Patient Name: Maria Bruce Today's Date: 10/25/2014     Maternal Data    Feeding    LATCH Score/Interventions                      Lactation Tools Discussed/Used     Consult Status      Soyla DryerJoseph, Latish Toutant 10/25/2014, 2:21 PM

## 2014-10-25 NOTE — Anesthesia Postprocedure Evaluation (Signed)
  Anesthesia Post-op Note  Patient: Maria Bruce  Procedure(s) Performed: * No procedures listed *  Patient Location: Mother/Baby  Anesthesia Type:Epidural  Level of Consciousness: awake, alert , oriented and patient cooperative  Airway and Oxygen Therapy: Patient Spontanous Breathing  Post-op Pain: none  Post-op Assessment: Post-op Vital signs reviewed, Patient's Cardiovascular Status Stable, Respiratory Function Stable, Patent Airway, No headache, No backache, No residual numbness and No residual motor weakness  Post-op Vital Signs: Reviewed and stable  Last Vitals:  Filed Vitals:   10/25/14 0600  BP: 116/49  Pulse: 70  Temp: 37.1 C  Resp: 19    Complications: No apparent anesthesia complications

## 2014-10-26 ENCOUNTER — Encounter: Payer: Medicaid Other | Admitting: Obstetrics and Gynecology

## 2014-10-26 MED ORDER — SENNOSIDES-DOCUSATE SODIUM 8.6-50 MG PO TABS: 2.0000 | ORAL_TABLET | ORAL | Status: DC | PRN

## 2014-10-26 MED ORDER — IBUPROFEN 600 MG PO TABS: 600.0000 mg | ORAL_TABLET | Freq: Four times a day (QID) | ORAL | Status: DC | PRN

## 2014-10-26 NOTE — Lactation Note (Signed)
This note was copied from the chart of Boy Corrie DandyKimberly Rosa Maldonado. Lactation Consultation Note; Follow up visit with mom before DC. Mom reports she has been pumping some but gets more milk with hand expression. Reports she is getting a few cc's each time. Asking about putting the baby to the breast Encouraged to try when baby is not real hungry since he has been getting bottle. Reviewed engorgement prevention and treatment. Has WIC and manual pump. No questions at present. To call prn  Patient Name: Boy Corrie DandyKimberly Rosa Maldonado WSFKC'LToday's Date: 10/26/2014 Reason for consult: Follow-up assessment   Maternal Data Formula Feeding for Exclusion: No Has patient been taught Hand Expression?: Yes Does the patient have breastfeeding experience prior to this delivery?: No  Feeding    LATCH Score/Interventions                      Lactation Tools Discussed/Used     Consult Status Consult Status: Complete    Pamelia HoitWeeks, Tahirih Lair D 10/26/2014, 11:03 AM

## 2014-10-26 NOTE — Discharge Summary (Signed)
Obstetric Discharge Summary Reason for Admission: onset of labor Prenatal Procedures: ultrasound Intrapartum Procedures: spontaneous vaginal delivery Postpartum Procedures: none Complications-Operative and Postpartum: none  Delivery Summary: At 11:49 AM a viable female was delivered via Vaginal, Spontaneous Delivery (Presentation: Occiput Posterior). APGAR: 9, 9; weight .  Placenta status: Intact, Spontaneous. Cord: 3 vessels with the following complications: None.   Anesthesia: Epidural  Episiotomy: None Lacerations: 1st degree Suture Repair: 3.0 vicryl rapide Est. Blood Loss (mL): <57500mL  Hospital Course:  Active Problems:   Active labor at term  Maria Bruce is a 24 y.o. [id number] s/p SVD.  Patient was admitted in active labor.  She has postpartum course that was uncomplicated. The pt feels ready to go home and  will be discharged with outpatient follow-up.   Today: No acute events overnight.  Pt denies problems with ambulating, voiding or po intake.  She denies nausea or vomiting.  Pain is well controlled.  She has had flatus. She has had bowel movement.  Lochia Small.  Plan for birth control is  nexplanon.  Method of Feeding: Pump and bottle  Physical Exam:  General: alert, cooperative and no distress Lochia: appropriate Uterine Fundus: firm DVT Evaluation: No evidence of DVT seen on physical exam.  H/H: Lab Results  Component Value Date/Time   HGB 8.3* 10/24/2014 08:05 AM   HCT 27.3* 10/24/2014 08:05 AM    Discharge Diagnoses: Term Pregnancy-delivered  Discharge Information: Date: 10/26/2014 Activity: pelvic rest Diet: routine  Medications: PNV, Ibuprofen and Senakot-S Breast feeding:  Yes Condition: stable Instructions: refer to handout Discharge to: home       Discharge Instructions    Increase activity slowly    Complete by:  As directed             Medication List    TAKE these medications        ibuprofen 600 MG tablet   Commonly known as:  ADVIL,MOTRIN  Take 1 tablet (600 mg total) by mouth every 6 (six) hours as needed.     prenatal multivitamin Tabs tablet  Take 1 tablet by mouth daily at 12 noon.     senna-docusate 8.6-50 MG per tablet  Commonly known as:  Senokot-S  Take 2 tablets by mouth as needed for mild constipation.       Follow-up Information    Schedule an appointment as soon as possible [facility]   Specialty:  Obstetrics and Gynecology   Why:  for postpartum visit   Contact information:   [address] [place] [id number] [phone]     Maria AdaJazma Phelps, DO 10/26/2014, 10:43 AM PGY-1, [facility]   I have seen and examined this patient and agree the above assessment. Maria Bruce 10/27/2014 11:09 AM

## 2014-12-06 ENCOUNTER — Ambulatory Visit (INDEPENDENT_AMBULATORY_CARE_PROVIDER_SITE_OTHER): Payer: Medicaid Other | Admitting: Medical

## 2014-12-06 ENCOUNTER — Encounter: Payer: Self-pay | Admitting: Medical

## 2014-12-06 DIAGNOSIS — Z3042 Encounter for surveillance of injectable contraceptive: Secondary | ICD-10-CM

## 2014-12-06 DIAGNOSIS — Z30013 Encounter for initial prescription of injectable contraceptive: Secondary | ICD-10-CM

## 2014-12-06 DIAGNOSIS — Z3202 Encounter for pregnancy test, result negative: Secondary | ICD-10-CM

## 2014-12-06 LAB — POCT PREGNANCY, URINE: PREG TEST UR: NEGATIVE

## 2014-12-06 MED ORDER — MEDROXYPROGESTERONE ACETATE 150 MG/ML IM SUSP
150.0000 mg | INTRAMUSCULAR | Status: AC
  Administered 2014-12-06: 150 mg via INTRAMUSCULAR

## 2014-12-06 NOTE — Progress Notes (Signed)
Patient ID: Maria Bruce, female   DOB: 1990/11/29, 24 y.o.   MRN: [id number] Subjective:     Maria Bruce is a 24 y.o. female who presents for a postpartum visit. She is 6 weeks postpartum following a spontaneous vaginal delivery. I have fully reviewed the prenatal and intrapartum course. The delivery was at 39.5 gestational weeks. Outcome: spontaneous vaginal delivery. Anesthesia: epidural. Postpartum course has been normal. Baby's course has been normal. Baby is feeding by bottle - Similac Advance. Bleeding red. Bowel function is normal. Bladder function is normal. Patient is sexually active. Contraception method is none. Last intercourse 2 weeks ago. Postpartum depression screening: negative.  The following portions of the patient's history were reviewed and updated as appropriate: allergies, current medications, past family history, past medical history, past social history, past surgical history and problem list.  Review of Systems Pertinent items are noted in HPI.   Objective:    BP 121/60 mmHg  Pulse 79  Temp(Src) 98.8 F (37.1 C) (Oral)  Ht 5\' 5"  (1.651 m)  Wt 203 lb 6.4 oz (92.262 kg)  BMI 33.85 kg/m2  LMP 12/04/2014  Breastfeeding? No  General:  alert and cooperative   Breasts:  not performed  Lungs: clear to auscultation bilaterally  Heart:  regular rate and rhythm, S1, S2 normal, no murmur, click, rub or gallop  Abdomen: soft, non-tender; bowel sounds normal; no masses,  no organomegaly   Vulva:  not evaluated  Vagina: Not evaluated  Cervix:  not evaluated  Corpus: not examined  Adnexa:  not evaluated  Rectal Exam: Not performed.        Assessment:     Normal postpartum exam. Pap smear not done at today's visit.  Last pap was negative on 10/15/14. Next pap smear due 10/2017  Plan:    1. Contraception: Depo-Provera injections 2. Follow up in: 1 year for annual exam or as needed.

## 2014-12-06 NOTE — Patient Instructions (Signed)

## 2015-02-21 ENCOUNTER — Ambulatory Visit: Payer: Medicaid Other

## 2015-02-27 ENCOUNTER — Ambulatory Visit: Payer: Medicaid Other

## 2016-04-14 ENCOUNTER — Ambulatory Visit (INDEPENDENT_AMBULATORY_CARE_PROVIDER_SITE_OTHER): Payer: Self-pay | Admitting: Orthopaedic Surgery

## 2016-04-24 ENCOUNTER — Ambulatory Visit (INDEPENDENT_AMBULATORY_CARE_PROVIDER_SITE_OTHER): Payer: Medicaid Other

## 2016-04-24 ENCOUNTER — Ambulatory Visit (INDEPENDENT_AMBULATORY_CARE_PROVIDER_SITE_OTHER): Payer: Medicaid Other | Admitting: Orthopaedic Surgery

## 2016-04-24 ENCOUNTER — Encounter (INDEPENDENT_AMBULATORY_CARE_PROVIDER_SITE_OTHER): Payer: Self-pay | Admitting: Orthopaedic Surgery

## 2016-04-24 DIAGNOSIS — M25562 Pain in left knee: Secondary | ICD-10-CM

## 2016-04-24 DIAGNOSIS — G8929 Other chronic pain: Secondary | ICD-10-CM

## 2016-04-24 DIAGNOSIS — M25552 Pain in left hip: Secondary | ICD-10-CM

## 2016-04-24 DIAGNOSIS — M25551 Pain in right hip: Secondary | ICD-10-CM

## 2016-04-24 NOTE — Progress Notes (Signed)
Office Visit Note   Patient: Maria Bruce           Date of Birth: [birth year]           MRN: [id number] Visit Date: 04/24/2016              Requested by: No referring provider defined for this encounter. PCP: No PCP Per Patient   Assessment & Plan: Visit Diagnoses:  1. Chronic pain of left knee   2. Pain in left hip   3. Pain in right hip     Plan: X-rays were reviewed with the patient. I recommend a neoprene knee sleeve for patellar stabilization. I recommended home exercises which I did give her today. I think she is having issues with quad weakness again if she needs to get back into strengthening. Over-the-counter NSAIDs as needed. Follow-up with me as needed  Follow-Up Instructions: Return if symptoms worsen or fail to improve.   Orders:  Orders Placed This Encounter  Procedures  . XR KNEE 3 VIEW LEFT  . XR Pelvis 1-2 Views   No orders of the defined types were placed in this encounter.     Procedures: No procedures performed   Clinical Data: No additional findings.   Subjective: Chief Complaint  Patient presents with  . Left Knee - Pain    Patient is a healthy 25 year old female comes in with worsening left knee pain. She states that she started having more pain with kneeling again. She is having trouble going upstairs. She started having numbness and cramping feeling that goes into both of her hips and buttock. She cleans houses for a living. Denies any injuries to her patella. She did have a patellar dislocation several years ago. Pain does not radiate. Denies any mechanical symptoms.    Review of Systems  Constitutional: Negative.   HENT: Negative.   Eyes: Negative.   Respiratory: Negative.   Cardiovascular: Negative.   Endocrine: Negative.   Musculoskeletal: Negative.   Neurological: Negative.   Hematological: Negative.   Psychiatric/Behavioral: Negative.   All other systems reviewed and are negative.    Objective: Vital Signs:  There were no vitals taken for this visit.  Physical Exam  Constitutional: She is oriented to person, place, and time. She appears well-developed and well-nourished.  HENT:  Head: Atraumatic.  Eyes: EOM are normal.  Neck: Neck supple.  Cardiovascular: Intact distal pulses.   Pulmonary/Chest: Effort normal.  Abdominal: Soft.  Neurological: She is alert and oriented to person, place, and time.  Skin: Skin is warm. Capillary refill takes less than 2 seconds.  Psychiatric: She has a normal mood and affect. Her behavior is normal. Judgment and thought content normal.  Nursing note and vitals reviewed.   Ortho Exam Exam of her left knee shows no effusion. Patella mobility is normal. Range of motion is normal. Pleasant cruciates are stable. No signs of infection.  Exam of bilateral hips shows mild tenderness to the SI joints. She has no pain with hip motion. Negative Stinchfield sign. No sciatic tension signs. No focal motor or sensory deficits. Normal reflexes. Specialty Comments:  No specialty comments available.  Imaging: Xr Knee 3 View Left  Result Date: 04/24/2016 Negative for degenerative joint disease and acute findings. The left patella is well seated in the trochlea.  Xr Pelvis 1-2 Views  Result Date: 04/24/2016 Negative for acute abnormalities or degenerative disease.    PMFS History: There are no active problems to display for this patient.  Past Medical  History:  Diagnosis Date  . Asthma    as a child    Family History  Problem Relation Age of Onset  . Hypertension Paternal Grandmother   . Diabetes Paternal Grandmother   . Heart attack Mother     Past Surgical History:  Procedure Laterality Date  . NO PAST SURGERIES     Social History   Occupational History  . Not on file.   Social History Main Topics  . Smoking status: Never Smoker  . Smokeless tobacco: Never Used  . Alcohol use No  . Drug use: No  . Sexual activity: Yes    Birth control/  protection: None

## 2016-06-08 NOTE — L&D Delivery Note (Signed)
26 y.o. [id number] at 6661w0d delivered a viable female infant in cephalic, ROA Anterior shoulder delivered with ease. 60 sec delayed cord clamping. Cord clamped x2 and cut. Placenta delivered spontaneously intact, with 3VC. Fundus firm on exam with massage and pitocin. Good hemostasis noted.  Laceration: 1st degree perineal Suture: 2.0 plain catgut Good hemostasis noted. EBL 150cc  Mom and baby recovering in LDR.    Apgars: 9/9 Weight: pending    Renne Muscaaniel L Synethia Endicott, MD PGY-1 07/16/2016, 7:04 PM

## 2016-06-18 ENCOUNTER — Inpatient Hospital Stay (HOSPITAL_COMMUNITY)
Admission: AD | Admit: 2016-06-18 | Discharge: 2016-06-19 | Disposition: A | Discharge status: Home / Self Care | Payer: Medicaid Other | Source: Ambulatory Visit | Attending: Obstetrics and Gynecology | Admitting: Obstetrics and Gynecology

## 2016-06-18 ENCOUNTER — Encounter (HOSPITAL_COMMUNITY): Payer: Self-pay | Admitting: *Deleted

## 2016-06-18 DIAGNOSIS — R102 Pelvic and perineal pain: Secondary | ICD-10-CM | POA: Insufficient documentation

## 2016-06-18 DIAGNOSIS — Z8249 Family history of ischemic heart disease and other diseases of the circulatory system: Secondary | ICD-10-CM | POA: Diagnosis not present

## 2016-06-18 DIAGNOSIS — O9989 Other specified diseases and conditions complicating pregnancy, childbirth and the puerperium: Secondary | ICD-10-CM | POA: Diagnosis not present

## 2016-06-18 DIAGNOSIS — Z3A00 Weeks of gestation of pregnancy not specified: Secondary | ICD-10-CM | POA: Insufficient documentation

## 2016-06-18 DIAGNOSIS — O26899 Other specified pregnancy related conditions, unspecified trimester: Secondary | ICD-10-CM | POA: Diagnosis not present

## 2016-06-18 DIAGNOSIS — Z363 Encounter for antenatal screening for malformations: Secondary | ICD-10-CM

## 2016-06-18 DIAGNOSIS — R109 Unspecified abdominal pain: Secondary | ICD-10-CM | POA: Diagnosis not present

## 2016-06-18 DIAGNOSIS — Z3493 Encounter for supervision of normal pregnancy, unspecified, third trimester: Secondary | ICD-10-CM

## 2016-06-18 DIAGNOSIS — R1030 Lower abdominal pain, unspecified: Secondary | ICD-10-CM | POA: Diagnosis present

## 2016-06-18 LAB — URINALYSIS, ROUTINE W REFLEX MICROSCOPIC
Bilirubin Urine: NEGATIVE
Glucose, UA: NEGATIVE mg/dL
Hgb urine dipstick: NEGATIVE
Ketones, ur: NEGATIVE mg/dL
Nitrite: NEGATIVE
Protein, ur: NEGATIVE mg/dL
SPECIFIC GRAVITY, URINE: 1.026 (ref 1.005–1.030)
pH: 5 (ref 5.0–8.0)

## 2016-06-18 LAB — POCT PREGNANCY, URINE: Preg Test, Ur: POSITIVE — AB

## 2016-06-18 NOTE — MAU Note (Signed)
Pt reports she was at work and had some pulling pain in her lower abd when she stood up. States she has had pressure in her lower abd since last night.

## 2016-06-19 ENCOUNTER — Encounter (HOSPITAL_COMMUNITY): Payer: Self-pay | Admitting: *Deleted

## 2016-06-19 DIAGNOSIS — R109 Unspecified abdominal pain: Secondary | ICD-10-CM

## 2016-06-19 DIAGNOSIS — O9989 Other specified diseases and conditions complicating pregnancy, childbirth and the puerperium: Secondary | ICD-10-CM | POA: Diagnosis not present

## 2016-06-19 NOTE — Discharge Instructions (Signed)
Third Trimester of Pregnancy The third trimester is from week 29 through week 40 (months 7 through 9). The third trimester is a time when the unborn baby (fetus) is growing rapidly. At the end of the ninth month, the fetus is about 20 inches in length and weighs 6-10 pounds. Body changes during your third trimester Your body goes through many changes during pregnancy. The changes vary from woman to woman. During the third trimester:  Your weight will continue to increase. You can expect to gain 25-35 pounds (11-16 kg) by the end of the pregnancy.  You may begin to get stretch marks on your hips, abdomen, and breasts.  You may urinate more often because the fetus is moving lower into your pelvis and pressing on your bladder.  You may develop or continue to have heartburn. This is caused by increased hormones that slow down muscles in the digestive tract.  You may develop or continue to have constipation because increased hormones slow digestion and cause the muscles that push waste through your intestines to relax.  You may develop hemorrhoids. These are swollen veins (varicose veins) in the rectum that can itch or be painful.  You may develop swollen, bulging veins (varicose veins) in your legs.  You may have increased body aches in the pelvis, back, or thighs. This is due to weight gain and increased hormones that are relaxing your joints.  You may have changes in your hair. These can include thickening of your hair, rapid growth, and changes in texture. Some women also have hair loss during or after pregnancy, or hair that feels dry or thin. Your hair will most likely return to normal after your baby is born.  Your breasts will continue to grow and they will continue to become tender. A yellow fluid (colostrum) may leak from your breasts. This is the first milk you are producing for your baby.  Your belly button may stick out.  You may notice more swelling in your hands, face, or  ankles.  You may have increased tingling or numbness in your hands, arms, and legs. The skin on your belly may also feel numb.  You may feel short of breath because of your expanding uterus.  You may have more problems sleeping. This can be caused by the size of your belly, increased need to urinate, and an increase in your body's metabolism.  You may notice the fetus "dropping," or moving lower in your abdomen.  You may have increased vaginal discharge.  Your cervix becomes thin and soft (effaced) near your due date. What to expect at prenatal visits You will have prenatal exams every 2 weeks until week 36. Then you will have weekly prenatal exams. During a routine prenatal visit:  You will be weighed to make sure you and the fetus are growing normally.  Your blood pressure will be taken.  Your abdomen will be measured to track your baby's growth.  The fetal heartbeat will be listened to.  Any test results from the previous visit will be discussed.  You may have a cervical check near your due date to see if you have effaced. At around 36 weeks, your health care provider will check your cervix. At the same time, your health care provider will also perform a test on the secretions of the vaginal tissue. This test is to determine if a type of bacteria, Group B streptococcus, is present. Your health care provider will explain this further. Your health care provider may ask you:    What your birth plan is.  How you are feeling.  If you are feeling the baby move.  If you have had any abnormal symptoms, such as leaking fluid, bleeding, severe headaches, or abdominal cramping.  If you are using any tobacco products, including cigarettes, chewing tobacco, and electronic cigarettes.  If you have any questions. Other tests or screenings that may be performed during your third trimester include:  Blood tests that check for low iron levels (anemia).  Fetal testing to check the health,  activity level, and growth of the fetus. Testing is done if you have certain medical conditions or if there are problems during the pregnancy.  Nonstress test (NST). This test checks the health of your baby to make sure there are no signs of problems, such as the baby not getting enough oxygen. During this test, a belt is placed around your belly. The baby is made to move, and its heart rate is monitored during movement. What is false labor? False labor is a condition in which you feel small, irregular tightenings of the muscles in the womb (contractions) that eventually go away. These are called Braxton Hicks contractions. Contractions may last for hours, days, or even weeks before true labor sets in. If contractions come at regular intervals, become more frequent, increase in intensity, or become painful, you should see your health care provider. What are the signs of labor?  Abdominal cramps.  Regular contractions that start at 10 minutes apart and become stronger and more frequent with time.  Contractions that start on the top of the uterus and spread down to the lower abdomen and back.  Increased pelvic pressure and dull back pain.  A watery or bloody mucus discharge that comes from the vagina.  Leaking of amniotic fluid. This is also known as your "water breaking." It could be a slow trickle or a gush. Let your doctor know if it has a color or strange odor. If you have any of these signs, call your health care provider right away, even if it is before your due date. Follow these instructions at home: Eating and drinking  Continue to eat regular, healthy meals.  Do not eat:  Raw meat or meat spreads.  Unpasteurized milk or cheese.  Unpasteurized juice.  Store-made salad.  Refrigerated smoked seafood.  Hot dogs or deli meat, unless they are piping hot.  More than 6 ounces of albacore tuna a week.  Shark, swordfish, king mackerel, or tile fish.  Store-made salads.  Raw  sprouts, such as mung bean or alfalfa sprouts.  Take prenatal vitamins as told by your health care provider.  Take 1000 mg of calcium daily as told by your health care provider.  If you develop constipation:  Take over-the-counter or prescription medicines.  Drink enough fluid to keep your urine clear or pale yellow.  Eat foods that are high in fiber, such as fresh fruits and vegetables, whole grains, and beans.  Limit foods that are high in fat and processed sugars, such as fried and sweet foods. Activity  Exercise only as directed by your health care provider. Healthy pregnant women should aim for 2 hours and 30 minutes of moderate exercise per week. If you experience any pain or discomfort while exercising, stop.  Avoid heavy lifting.  Do not exercise in extreme heat or humidity, or at high altitudes.  Wear low-heel, comfortable shoes.  Practice good posture.  Do not travel far distances unless it is absolutely necessary and only with the approval   of your health care provider.  Wear your seat belt at all times while in a car, on a bus, or on a plane.  Take frequent breaks and rest with your legs elevated if you have leg cramps or low back pain.  Do not use hot tubs, steam rooms, or saunas.  You may continue to have sex unless your health care provider tells you otherwise. Lifestyle  Do not use any products that contain nicotine or tobacco, such as cigarettes and e-cigarettes. If you need help quitting, ask your health care provider.  Do not drink alcohol.  Do not use any medicinal herbs or unprescribed drugs. These chemicals affect the formation and growth of the baby.  If you develop varicose veins:  Wear support pantyhose or compression stockings as told by your healthcare provider.  Elevate your feet for 15 minutes, 3-4 times a day.  Wear a supportive maternity bra to help with breast tenderness. General instructions  Take over-the-counter and prescription  medicines only as told by your health care provider. There are medicines that are either safe or unsafe to take during pregnancy.  Take warm sitz baths to soothe any pain or discomfort caused by hemorrhoids. Use hemorrhoid cream or witch hazel if your health care provider approves.  Avoid cat litter boxes and soil used by cats. These carry germs that can cause birth defects in the baby. If you have a cat, ask someone to clean the litter box for you.  To prepare for the arrival of your baby:  Take prenatal classes to understand, practice, and ask questions about the labor and delivery.  Make a trial run to the hospital.  Visit the hospital and tour the maternity area.  Arrange for maternity or paternity leave through employers.  Arrange for family and friends to take care of pets while you are in the hospital.  Purchase a rear-facing car seat and make sure you know how to install it in your car.  Pack your hospital bag.  Prepare the baby's nursery. Make sure to remove all pillows and stuffed animals from the baby's crib to prevent suffocation.  Visit your dentist if you have not gone during your pregnancy. Use a soft toothbrush to brush your teeth and be gentle when you floss.  Keep all prenatal follow-up visits as told by your health care provider. This is important. Contact a health care provider if:  You are unsure if you are in labor or if your water has broken.  You become dizzy.  You have mild pelvic cramps, pelvic pressure, or nagging pain in your abdominal area.  You have lower back pain.  You have persistent nausea, vomiting, or diarrhea.  You have an unusual or bad smelling vaginal discharge.  You have pain when you urinate. Get help right away if:  You have a fever.  You are leaking fluid from your vagina.  You have spotting or bleeding from your vagina.  You have severe abdominal pain or cramping.  You have rapid weight loss or weight gain.  You have  shortness of breath with chest pain.  You notice sudden or extreme swelling of your face, hands, ankles, feet, or legs.  Your baby makes fewer than 10 movements in 2 hours.  You have severe headaches that do not go away with medicine.  You have vision changes. Summary  The third trimester is from week 29 through week 40, months 7 through 9. The third trimester is a time when the unborn baby (fetus)   is growing rapidly.  During the third trimester, your discomfort may increase as you and your baby continue to gain weight. You may have abdominal, leg, and back pain, sleeping problems, and an increased need to urinate.  During the third trimester your breasts will keep growing and they will continue to become tender. A yellow fluid (colostrum) may leak from your breasts. This is the first milk you are producing for your baby.  False labor is a condition in which you feel small, irregular tightenings of the muscles in the womb (contractions) that eventually go away. These are called Braxton Hicks contractions. Contractions may last for hours, days, or even weeks before true labor sets in.  Signs of labor can include: abdominal cramps; regular contractions that start at 10 minutes apart and become stronger and more frequent with time; watery or bloody mucus discharge that comes from the vagina; increased pelvic pressure and dull back pain; and leaking of amniotic fluid. This information is not intended to replace advice given to you by your health care provider. Make sure you discuss any questions you have with your health care provider. Document Released: 05/19/2001 Document Revised: 10/31/2015 Document Reviewed: 07/26/2012 Elsevier Interactive Patient Education  2017 [employer]  

## 2016-06-19 NOTE — MAU Provider Note (Signed)
None     Chief Complaint:  Pelvic pressure  Maria Bruce is  26 y.o. [phone] at Unknown presents complaining of lower abdominal pain after picking something up at work.  Pt states she "knew she was pregnant but didn't know how far along".  Has not had any PNC.  Feels fetal movement, fetal moverment is visible.     Obstetrical/Gynecological History: OB History    Gravida Para Term Preterm AB Living   5 4 4  0 0 4   SAB TAB Ectopic Multiple Live Births   0 0 0 0 4     Past Medical History: Past Medical History:  Diagnosis Date  . Asthma    as a child    Past Surgical History: Past Surgical History:  Procedure Laterality Date  . NO PAST SURGERIES      Family History: Family History  Problem Relation Age of Onset  . Heart attack Mother   . Hypertension Paternal Grandmother   . Diabetes Paternal Grandmother     Social History: Social History  Substance Use Topics  . Smoking status: Never Smoker  . Smokeless tobacco: Never Used  . Alcohol use No    Allergies: No Known Allergies  Meds:  Prescriptions Prior to Admission  Medication Sig Dispense Refill Last Dose  . ibuprofen (ADVIL,MOTRIN) 600 MG tablet Take 1 tablet (600 mg total) by mouth every 6 (six) hours as needed. (Patient not taking: Reported on 04/24/2016) 30 tablet 0 Not Taking  . Prenatal Vit-Fe Fumarate-FA (PRENATAL MULTIVITAMIN) TABS tablet Take 1 tablet by mouth daily at 12 noon.   Not Taking  . senna-docusate (SENOKOT-S) 8.6-50 MG per tablet Take 2 tablets by mouth as needed for mild constipation. (Patient not taking: Reported on 04/24/2016) 30 tablet 0 Not Taking    Review of Systems   Constitutional: Negative for fever and chills Eyes: Negative for visual disturbances Respiratory: Negative for shortness of breath, dyspnea Cardiovascular: Negative for chest pain or palpitations  Gastrointestinal: Negative for vomiting, diarrhea and constipation Genitourinary: Negative for dysuria and  urgency Musculoskeletal: Negative for joint pain, myalgias.  Normal ROM  Neurological: Negative for dizziness and headaches    Physical Exam  Blood pressure 114/57, pulse 92, temperature 97.9 F (36.6 C), temperature source Oral, resp. rate 20, height 5\' 4"  (1.626 m), weight 98.2 kg (216 lb 8 oz), SpO2 98 %, not currently breastfeeding. GENERAL: Well-developed, well-nourished female in no acute distress.  LUNGS: Clear to auscultation bilaterally.  HEART: Regular rate and rhythm. ABDOMEN: Soft, nontender, nondistended, gravid. FH 35 cms, ~ 7lbs by Leopold's EXTREMITIES: Nontender, no edema, 2+ distal pulses. DTR's 2+ CERVICAL EXAM: Dilatation 1cm   Effacement 0%   Station -3   Presentation: cephalic FHT:  Baseline rate 140 bpm   Variability moderate  Accelerations present   Decelerations none Contractions: Every 0 mins   Labs: Results for orders placed or performed during the hospital encounter of 06/18/16 (from the past 24 hour(s))  Urinalysis, Routine w reflex microscopic   Collection Time: 06/18/16 10:23 PM  Result Value Ref Range   Color, Urine YELLOW YELLOW   APPearance HAZY (A) CLEAR   Specific Gravity, Urine 1.026 1.005 - 1.030   pH 5.0 5.0 - 8.0   Glucose, UA NEGATIVE NEGATIVE mg/dL   Hgb urine dipstick NEGATIVE NEGATIVE   Bilirubin Urine NEGATIVE NEGATIVE   Ketones, ur NEGATIVE NEGATIVE mg/dL   Protein, ur NEGATIVE NEGATIVE mg/dL   Nitrite NEGATIVE NEGATIVE   Leukocytes, UA LARGE (A) NEGATIVE  RBC / HPF 0-5 0 - 5 RBC/hpf   WBC, UA 6-30 0 - 5 WBC/hpf   Bacteria, UA RARE (A) NONE SEEN   Squamous Epithelial / LPF 6-30 (A) NONE SEEN   Mucous PRESENT   Pregnancy, urine POC   Collection Time: 06/18/16 10:40 PM  Result Value Ref Range   Preg Test, Ur POSITIVE (A) NEGATIVE   Imaging Studies:  No results found.  Assessment: Maria Bruce is  26 y.o. [phone] at Unknown presents with pregnancy, round ligament pain.  Plan: Order for outpt [place] in EPIC,  message sent to [facility] to call for an appt to start [facility]  CRESENZO-DISHMAN,Greyson Riccardi 1/12/201812:56 AM

## 2016-06-22 ENCOUNTER — Ambulatory Visit (HOSPITAL_COMMUNITY)
Admission: RE | Admit: 2016-06-22 | Discharge: 2016-06-22 | Disposition: A | Discharge status: Home / Self Care | Payer: Medicaid Other | Source: Ambulatory Visit | Attending: Advanced Practice Midwife | Admitting: Advanced Practice Midwife

## 2016-06-22 DIAGNOSIS — Z3A37 37 weeks gestation of pregnancy: Secondary | ICD-10-CM | POA: Diagnosis not present

## 2016-06-22 DIAGNOSIS — O0933 Supervision of pregnancy with insufficient antenatal care, third trimester: Secondary | ICD-10-CM | POA: Diagnosis not present

## 2016-06-22 DIAGNOSIS — Z363 Encounter for antenatal screening for malformations: Secondary | ICD-10-CM | POA: Insufficient documentation

## 2016-06-23 ENCOUNTER — Encounter: Payer: Self-pay | Admitting: Advanced Practice Midwife

## 2016-06-23 ENCOUNTER — Ambulatory Visit (INDEPENDENT_AMBULATORY_CARE_PROVIDER_SITE_OTHER): Payer: Medicaid Other | Admitting: Advanced Practice Midwife

## 2016-06-23 ENCOUNTER — Other Ambulatory Visit (HOSPITAL_COMMUNITY)
Admission: RE | Admit: 2016-06-23 | Discharge: 2016-06-23 | Disposition: A | Discharge status: Home / Self Care | Payer: Medicaid Other | Source: Ambulatory Visit | Attending: Advanced Practice Midwife | Admitting: Advanced Practice Midwife

## 2016-06-23 VITALS — BP 105/51 | HR 74 | Wt 213.5 lb

## 2016-06-23 DIAGNOSIS — Z01419 Encounter for gynecological examination (general) (routine) without abnormal findings: Secondary | ICD-10-CM | POA: Insufficient documentation

## 2016-06-23 DIAGNOSIS — Z113 Encounter for screening for infections with a predominantly sexual mode of transmission: Secondary | ICD-10-CM

## 2016-06-23 DIAGNOSIS — Z23 Encounter for immunization: Secondary | ICD-10-CM

## 2016-06-23 DIAGNOSIS — B373 Candidiasis of vulva and vagina: Secondary | ICD-10-CM

## 2016-06-23 DIAGNOSIS — O0933 Supervision of pregnancy with insufficient antenatal care, third trimester: Secondary | ICD-10-CM | POA: Diagnosis present

## 2016-06-23 DIAGNOSIS — O09899 Supervision of other high risk pregnancies, unspecified trimester: Secondary | ICD-10-CM | POA: Insufficient documentation

## 2016-06-23 DIAGNOSIS — O9883 Other maternal infectious and parasitic diseases complicating the puerperium: Secondary | ICD-10-CM

## 2016-06-23 DIAGNOSIS — B3731 Acute candidiasis of vulva and vagina: Secondary | ICD-10-CM

## 2016-06-23 LAB — POCT URINALYSIS DIP (DEVICE)
Bilirubin Urine: NEGATIVE
Glucose, UA: NEGATIVE mg/dL
Ketones, ur: 15 mg/dL — AB
Nitrite: NEGATIVE
PH: 6.5 (ref 5.0–8.0)
Protein, ur: NEGATIVE mg/dL
Specific Gravity, Urine: 1.02 (ref 1.005–1.030)
UROBILINOGEN UA: 0.2 mg/dL (ref 0.0–1.0)

## 2016-06-23 LAB — OB RESULTS CONSOLE GBS: GBS: NEGATIVE

## 2016-06-23 MED ORDER — TERCONAZOLE 0.4 % VA CREA: 1.0000 | TOPICAL_CREAM | Freq: Every day | VAGINAL | 0 refills | Status: DC

## 2016-06-23 NOTE — Progress Notes (Signed)
   PRENATAL VISIT NOTE  Subjective:  Maria Bruce is a 26 y.o. [id number] at 1321w4d by third trimester US yesterday being seen today for initial prenatal visit.  She is currently monitored for the following issues for this high-risk pregnancy and no prenatal care.   Patient reports occasional contractions.  Contractions: Not present. Vag. Bleeding: None.  Movement: Present. Denies leaking of fluid.   The following portions of the patient's history were reviewed and updated as appropriate: allergies, current medications, past family history, past medical history, past social history, past surgical history and problem list. Problem list updated.  Objective:   Vitals:   06/23/16 0852  BP: (!) 105/51  Pulse: 74  Weight: 213 lb 8 oz (96.8 kg)    Fetal Status:     Movement: Present     General:  Alert, oriented and cooperative. Patient is in no acute distress.  Skin: Skin is warm and dry. No rash noted.   Cardiovascular: Normal heart rate noted  Respiratory: Normal respiratory effort, no problems with respiration noted  Abdomen: Soft, gravid, appropriate for gestational age. Pain/Pressure: Present     Pelvic:  Cervical exam performed      1/long, vtx,. Large amount of thick, curd-like discharge. Unable to tolerate speculum exam. Blind Pap collected.   Extremities: Normal range of motion.  Edema: None  Mental Status: Normal mood and affect. Normal behavior. Normal judgment and thought content.   Assessment and Plan:  Pregnancy: G5P4004 at 3121w4d  1. Supervision of other high risk pregnancy, antepartum - TDaP - Prenatal Profile - Cytology - PAP - GC/Chlamydia probe amp ([place] - Hemoglobinopathy Evaluation - 2Hr GTT w/ 1 Hr Carpenter 75 g - Culture, OB Urine - Pain Mgmt, Profile 6 Conf w/o mM, U - Cystic fibrosis diagnostic study - terconazole (TERAZOL 7) 0.4 % vaginal cream; Place 1 applicator vaginally at bedtime.  Dispense: 45 g; Refill: 0 - Culture, beta  strep (group b only)  2. Limited prenatal care in third trimester  - Prenatal Profile - Cytology - PAP - GC/Chlamydia probe amp ([place] - Hemoglobinopathy Evaluation - 2Hr GTT w/ 1 Hr [place] 75 g - Culture, OB Urine - Pain Mgmt, Profile 6 Conf w/o mM, U - Cystic fibrosis diagnostic study - terconazole (TERAZOL 7) 0.4 % vaginal cream; Place 1 applicator vaginally at bedtime.  Dispense: 45 g; Refill: 0 - Culture, beta strep (group b only)  3. Vaginal yeast infection  - Prenatal Profile - Cytology - PAP - GC/Chlamydia probe amp ([place] - Hemoglobinopathy Evaluation - 2Hr GTT w/ 1 Hr [place] 75 g - Culture, OB Urine - Pain Mgmt, Profile 6 Conf w/o mM, U - Cystic fibrosis diagnostic study - terconazole (TERAZOL 7) 0.4 % vaginal cream; Place 1 applicator vaginally at bedtime.  Dispense: 45 g; Refill: 0 - Culture, beta strep (group b only)  Term labor symptoms and general obstetric precautions including but not limited to vaginal bleeding, contractions, leaking of fluid and fetal movement were reviewed in detail with the patient. Genetic screening: Too late. Please refer to After Visit Summary for other counseling recommendations.  F/U 1 week   [place] Mosi Hannold, [place]

## 2016-06-23 NOTE — Patient Instructions (Signed)
Braxton Hicks Contractions °Contractions of the uterus can occur throughout pregnancy. Contractions are not always a sign that you are in labor.  °WHAT ARE BRAXTON HICKS CONTRACTIONS?  °Contractions that occur before labor are called Braxton Hicks contractions, or false labor. Toward the end of pregnancy (32-34 weeks), these contractions can develop more often and may become more forceful. This is not true labor because these contractions do not result in opening (dilatation) and thinning of the cervix. They are sometimes difficult to tell apart from true labor because these contractions can be forceful and people have different pain tolerances. You should not feel embarrassed if you go to the hospital with false labor. Sometimes, the only way to tell if you are in true labor is for your health care provider to look for changes in the cervix. °If there are no prenatal problems or other health problems associated with the pregnancy, it is completely safe to be sent home with false labor and await the onset of true labor. °HOW CAN YOU TELL THE DIFFERENCE BETWEEN TRUE AND FALSE LABOR? °False Labor  °· The contractions of false labor are usually shorter and not as hard as those of true labor.   °· The contractions are usually irregular.   °· The contractions are often felt in the front of the lower abdomen and in the groin.   °· The contractions may go away when you walk around or change positions while lying down.   °· The contractions get weaker and are shorter lasting as time goes on.   °· The contractions do not usually become progressively stronger, regular, and closer together as with true labor.   °True Labor  °· Contractions in true labor last 30-70 seconds, become very regular, usually become more intense, and increase in frequency.   °· The contractions do not go away with walking.   °· The discomfort is usually felt in the top of the uterus and spreads to the lower abdomen and low back.   °· True labor can be  determined by your health care provider with an exam. This will show that the cervix is dilating and getting thinner.   °WHAT TO REMEMBER °· Keep up with your usual exercises and follow other instructions given by your health care provider.   °· Take medicines as directed by your health care provider.   °· Keep your regular prenatal appointments.   °· Eat and drink lightly if you think you are going into labor.   °· If Braxton Hicks contractions are making you uncomfortable:   °¨ Change your position from lying down or resting to walking, or from walking to resting.   °¨ Sit and rest in a tub of warm water.   °¨ Drink 2-3 glasses of water. Dehydration may cause these contractions.   °¨ Do slow and deep breathing several times an hour.   °WHEN SHOULD I SEEK IMMEDIATE MEDICAL CARE? °Seek immediate medical care if: °· Your contractions become stronger, more regular, and closer together.   °· You have fluid leaking or gushing from your vagina.   °· You have a fever.   °· You pass blood-tinged mucus.   °· You have vaginal bleeding.   °· You have continuous abdominal pain.   °· You have low back pain that you never had before.   °· You feel your baby's head pushing down and causing pelvic pressure.   °· Your baby is not moving as much as it used to.   °This information is not intended to replace advice given to you by your health care provider. Make sure you discuss any questions you have with your health care   provider. °Document Released: 05/25/2005 Document Revised: 09/16/2015 Document Reviewed: 03/06/2013 °Elsevier Interactive Patient Education © 2017 [employer] ° °

## 2016-06-23 NOTE — Addendum Note (Signed)
Addended by: Garret ReddishBARNES, Raizel Wesolowski M on: 06/23/2016 11:28 AM   Modules accepted: Orders

## 2016-06-24 LAB — 2HR GTT W 1 HR, CARPENTER, 75 G
Glucose, 1 Hr, Gest: 142 mg/dL (ref <180)
Glucose, 2 Hr, Gest: 87 mg/dL (ref <153)
Glucose, Fasting, Gest: 71 mg/dL (ref 65–91)

## 2016-06-24 LAB — PRENATAL PROFILE (SOLSTAS)
ANTIBODY SCREEN: NEGATIVE
BASOS PCT: 0 %
Basophils Absolute: 0 cells/uL (ref 0–200)
Eosinophils Absolute: 156 cells/uL (ref 15–500)
Eosinophils Relative: 2 %
HCT: 28.7 % — ABNORMAL LOW (ref 35.0–45.0)
HIV 1&2 Ab, 4th Generation: NONREACTIVE
Hemoglobin: 8.8 g/dL — ABNORMAL LOW (ref 11.7–15.5)
Hepatitis B Surface Ag: NEGATIVE
LYMPHS PCT: 18 %
Lymphs Abs: 1404 cells/uL (ref 850–3900)
MCH: 22.6 pg — ABNORMAL LOW (ref 27.0–33.0)
MCHC: 30.7 g/dL — ABNORMAL LOW (ref 32.0–36.0)
MCV: 73.6 fL — AB (ref 80.0–100.0)
MONO ABS: 702 {cells}/uL (ref 200–950)
MPV: 10.6 fL (ref 7.5–12.5)
Monocytes Relative: 9 %
NEUTROS ABS: 5538 {cells}/uL (ref 1500–7800)
Neutrophils Relative %: 71 %
Platelets: 225 10*3/uL (ref 140–400)
RBC: 3.9 MIL/uL (ref 3.80–5.10)
RDW: 16 % — ABNORMAL HIGH (ref 11.0–15.0)
RH TYPE: POSITIVE
Rubella: 5.49 Index — ABNORMAL HIGH (ref <0.90)
WBC: 7.8 10*3/uL (ref 3.8–10.8)

## 2016-06-24 LAB — CULTURE, OB URINE

## 2016-06-25 LAB — CYTOLOGY - PAP: DIAGNOSIS: NEGATIVE

## 2016-06-25 LAB — PAIN MGMT, PROFILE 6 CONF W/O MM, U
6 ACETYLMORPHINE: NEGATIVE ng/mL (ref <10)
ALCOHOL METABOLITES: NEGATIVE ng/mL (ref <500)
Amphetamines: NEGATIVE ng/mL (ref <500)
Barbiturates: NEGATIVE ng/mL (ref <300)
Benzodiazepines: NEGATIVE ng/mL (ref <100)
CREATININE: 133.4 mg/dL (ref >=20.0)
Cocaine Metabolite: NEGATIVE ng/mL (ref <150)
METHADONE METABOLITE: NEGATIVE ng/mL (ref <100)
Marijuana Metabolite: NEGATIVE ng/mL (ref <20)
Opiates: NEGATIVE ng/mL (ref <100)
Oxidant: NEGATIVE ug/mL (ref <200)
Oxycodone: NEGATIVE ng/mL (ref <100)
PH: 7.64 (ref 4.5–9.0)
PHENCYCLIDINE: NEGATIVE ng/mL (ref <25)
PLEASE NOTE: 0

## 2016-06-25 LAB — GC/CHLAMYDIA PROBE AMP (~~LOC~~) NOT AT ARMC
Chlamydia: NEGATIVE
Neisseria Gonorrhea: NEGATIVE

## 2016-06-25 LAB — CULTURE, BETA STREP (GROUP B ONLY)

## 2016-06-29 LAB — HEMOGLOBINOPATHY EVALUATION
HEMATOCRIT: 28.7 % — AB (ref 35.0–45.0)
HEMOGLOBIN: 8.8 g/dL — AB (ref 11.7–15.5)
Hgb A2 Quant: 1.9 % (ref 1.8–3.5)
Hgb A: 97.1 % (ref >96.0)
Hgb F Quant: <1 % (ref <2.0)
MCH: 22.6 pg — ABNORMAL LOW (ref 27.0–33.0)
MCV: 73.6 fL — ABNORMAL LOW (ref 80.0–100.0)
RBC: 3.9 MIL/uL (ref 3.80–5.10)
RDW: 16 % — ABNORMAL HIGH (ref 11.0–15.0)

## 2016-07-01 ENCOUNTER — Encounter: Payer: Self-pay | Admitting: Advanced Practice Midwife

## 2016-07-01 ENCOUNTER — Telehealth: Payer: Self-pay | Admitting: General Practice

## 2016-07-01 ENCOUNTER — Telehealth: Payer: Self-pay | Admitting: *Deleted

## 2016-07-01 ENCOUNTER — Encounter: Payer: Medicaid Other | Admitting: Advanced Practice Midwife

## 2016-07-01 DIAGNOSIS — O99019 Anemia complicating pregnancy, unspecified trimester: Secondary | ICD-10-CM | POA: Insufficient documentation

## 2016-07-01 NOTE — Telephone Encounter (Signed)
Called patient regarding need for iron BID per [place]. Patient verbalized understanding & states she has been calling all day to try to reschedule her appt. Told patient I would notify the front office and they will call her back to reschedule. Patient verbalized understanding and had no questions

## 2016-07-02 NOTE — Telephone Encounter (Signed)
error 

## 2016-07-03 ENCOUNTER — Encounter (HOSPITAL_COMMUNITY): Payer: Self-pay | Admitting: *Deleted

## 2016-07-03 ENCOUNTER — Inpatient Hospital Stay (HOSPITAL_COMMUNITY)
Admission: AD | Admit: 2016-07-03 | Discharge: 2016-07-03 | Disposition: A | Discharge status: Home / Self Care | Payer: Medicaid Other | Source: Ambulatory Visit | Attending: Obstetrics & Gynecology | Admitting: Obstetrics & Gynecology

## 2016-07-03 DIAGNOSIS — O09893 Supervision of other high risk pregnancies, third trimester: Secondary | ICD-10-CM | POA: Insufficient documentation

## 2016-07-03 DIAGNOSIS — O0933 Supervision of pregnancy with insufficient antenatal care, third trimester: Secondary | ICD-10-CM | POA: Insufficient documentation

## 2016-07-03 DIAGNOSIS — O26893 Other specified pregnancy related conditions, third trimester: Secondary | ICD-10-CM | POA: Diagnosis not present

## 2016-07-03 DIAGNOSIS — O99013 Anemia complicating pregnancy, third trimester: Secondary | ICD-10-CM | POA: Diagnosis not present

## 2016-07-03 DIAGNOSIS — Z3A39 39 weeks gestation of pregnancy: Secondary | ICD-10-CM | POA: Diagnosis not present

## 2016-07-03 DIAGNOSIS — N898 Other specified noninflammatory disorders of vagina: Secondary | ICD-10-CM | POA: Diagnosis not present

## 2016-07-03 DIAGNOSIS — D649 Anemia, unspecified: Secondary | ICD-10-CM | POA: Insufficient documentation

## 2016-07-03 DIAGNOSIS — Z8249 Family history of ischemic heart disease and other diseases of the circulatory system: Secondary | ICD-10-CM | POA: Diagnosis not present

## 2016-07-03 DIAGNOSIS — R109 Unspecified abdominal pain: Secondary | ICD-10-CM | POA: Insufficient documentation

## 2016-07-03 DIAGNOSIS — B373 Candidiasis of vulva and vagina: Secondary | ICD-10-CM

## 2016-07-03 DIAGNOSIS — O99019 Anemia complicating pregnancy, unspecified trimester: Secondary | ICD-10-CM

## 2016-07-03 DIAGNOSIS — B3731 Acute candidiasis of vulva and vagina: Secondary | ICD-10-CM

## 2016-07-03 DIAGNOSIS — O09899 Supervision of other high risk pregnancies, unspecified trimester: Secondary | ICD-10-CM

## 2016-07-03 LAB — WET PREP, GENITAL
Clue Cells Wet Prep HPF POC: NONE SEEN
Sperm: NONE SEEN
Trich, Wet Prep: NONE SEEN
Yeast Wet Prep HPF POC: NONE SEEN

## 2016-07-03 MED ORDER — TERCONAZOLE 0.4 % VA CREA: 1.0000 | TOPICAL_CREAM | Freq: Every day | VAGINAL | 0 refills | Status: DC

## 2016-07-03 NOTE — MAU Provider Note (Signed)
History   Patient Maria Bruce is a 26 year old G6P4004 at 39 weeks and 1 day by 37 week US here with complaints of leaking of fluid. Patient is late to prenatal care.    CSN: [id number]  Arrival date and time: 07/03/16 1919   None     Chief Complaint  Patient presents with  . Vaginal Discharge  . Abdominal Pain   Vaginal Discharge  The patient's primary symptoms include vaginal discharge. The patient's pertinent negatives include no genital itching, genital lesions, genital odor, genital rash, missed menses, pelvic pain or vaginal bleeding. This is a new problem. The current episode started today. The problem occurs 2 to 4 times per day. The problem has been unchanged. The pain is mild. She is pregnant. Associated symptoms include abdominal pain. Pertinent negatives include no anorexia, back pain, chills, constipation, diarrhea, discolored urine, dysuria, fever, flank pain, frequency, headaches, hematuria, joint pain, joint swelling, nausea, painful intercourse, rash, sore throat, urgency or vomiting. Nothing aggravates the symptoms. She has tried nothing for the symptoms. There is no history of a Cesarean section.  Abdominal Pain  Pertinent negatives include no anorexia, constipation, diarrhea, dysuria, fever, frequency, headaches, hematuria, nausea or vomiting.    OB History    Gravida Para Term Preterm AB Living   6 4 4  0 0 4   SAB TAB Ectopic Multiple Live Births   0 0 0 0 4      Past Medical History:  Diagnosis Date  . Asthma    as a child    Past Surgical History:  Procedure Laterality Date  . NO PAST SURGERIES      Family History  Problem Relation Age of Onset  . Heart attack Mother   . Hypertension Paternal Grandmother   . Diabetes Paternal Grandmother     Social History  Substance Use Topics  . Smoking status: Never Smoker  . Smokeless tobacco: Never Used  . Alcohol use No    Allergies: No Known Allergies  Prescriptions Prior to Admission   Medication Sig Dispense Refill Last Dose  . Prenatal Vit-Fe Fumarate-FA (PRENATAL MULTIVITAMIN) TABS tablet Take 1 tablet by mouth daily at 12 noon.   07/03/2016 at Unknown time  . terconazole (TERAZOL 7) 0.4 % vaginal cream Place 1 applicator vaginally at bedtime. (Patient not taking: Reported on 07/03/2016) 45 g 0 Completed Course at Unknown time    Review of Systems  Constitutional: Negative for chills and fever.  HENT: Negative for sore throat.   Eyes: Negative.   Respiratory: Negative.   Gastrointestinal: Positive for abdominal pain. Negative for anorexia, constipation, diarrhea, nausea and vomiting.  Endocrine: Negative.   Genitourinary: Positive for vaginal discharge. Negative for dysuria, flank pain, frequency, hematuria, missed menses, pelvic pain and urgency.  Musculoskeletal: Negative for back pain and joint pain.  Skin: Negative for rash.  Neurological: Negative for headaches.  Psychiatric/Behavioral: Negative.    Physical Exam   Blood pressure 114/57, pulse 95, temperature 98.6 F (37 C), resp. rate 18, height 5\' 4"  (1.626 m), weight 213 lb 3.2 oz (96.7 kg), not currently breastfeeding.  Physical Exam  Constitutional: She is oriented to person, place, and time. She appears well-developed.  HENT:  Head: Normocephalic.  Eyes: Pupils are equal, round, and reactive to light.  Neck: Normal range of motion.  Respiratory: Effort normal. No respiratory distress. She has no wheezes. She has no rales. She exhibits no tenderness.  GI: Soft. She exhibits no distension and no mass. There is  no tenderness. There is no rebound and no guarding.  Genitourinary: Vaginal discharge found.  Genitourinary Comments: NEFG; vaginal walls with no lesions; vaginal walls erythematous with adherent clumping white discharge. No pooling.   Musculoskeletal: Normal range of motion.  Neurological: She is alert and oriented to person, place, and time.  Skin: Skin is warm and dry.    MAU Course   Procedures  MDM -wet prep: negative FRH: 150 with moderate variability, present  accelerations, no decelerations.  -sterile spec exam: negative for pooling, negative ferning on slide Assessment and Plan   1. Vaginal discharge during pregnancy in third trimester   2. Anemia during pregnancy   3. Supervision of other high risk pregnancy, antepartum   4. Limited prenatal care in third trimester    2. Wet prep negative but vagina shows white clumpy discharge and reddened vaginal walls. RX for  terazole cream.  3.  D/C home with instructions to return to MAU if any bleeding, decreased fetal movements or leaking of fluids.     Charlesetta Garibaldi Kooistra CNM 07/03/2016, 8:49 PM

## 2016-07-03 NOTE — Discharge Instructions (Signed)
Braxton Hicks Contractions °Contractions of the uterus can occur throughout pregnancy. Contractions are not always a sign that you are in labor.  °WHAT ARE BRAXTON HICKS CONTRACTIONS?  °Contractions that occur before labor are called Braxton Hicks contractions, or false labor. Toward the end of pregnancy (32-34 weeks), these contractions can develop more often and may become more forceful. This is not true labor because these contractions do not result in opening (dilatation) and thinning of the cervix. They are sometimes difficult to tell apart from true labor because these contractions can be forceful and people have different pain tolerances. You should not feel embarrassed if you go to the hospital with false labor. Sometimes, the only way to tell if you are in true labor is for your health care provider to look for changes in the cervix. °If there are no prenatal problems or other health problems associated with the pregnancy, it is completely safe to be sent home with false labor and await the onset of true labor. °HOW CAN YOU TELL THE DIFFERENCE BETWEEN TRUE AND FALSE LABOR? °False Labor  °· The contractions of false labor are usually shorter and not as hard as those of true labor.   °· The contractions are usually irregular.   °· The contractions are often felt in the front of the lower abdomen and in the groin.   °· The contractions may go away when you walk around or change positions while lying down.   °· The contractions get weaker and are shorter lasting as time goes on.   °· The contractions do not usually become progressively stronger, regular, and closer together as with true labor.   °True Labor  °· Contractions in true labor last 30-70 seconds, become very regular, usually become more intense, and increase in frequency.   °· The contractions do not go away with walking.   °· The discomfort is usually felt in the top of the uterus and spreads to the lower abdomen and low back.   °· True labor can be  determined by your health care provider with an exam. This will show that the cervix is dilating and getting thinner.   °WHAT TO REMEMBER °· Keep up with your usual exercises and follow other instructions given by your health care provider.   °· Take medicines as directed by your health care provider.   °· Keep your regular prenatal appointments.   °· Eat and drink lightly if you think you are going into labor.   °· If Braxton Hicks contractions are making you uncomfortable:   °¨ Change your position from lying down or resting to walking, or from walking to resting.   °¨ Sit and rest in a tub of warm water.   °¨ Drink 2-3 glasses of water. Dehydration may cause these contractions.   °¨ Do slow and deep breathing several times an hour.   °WHEN SHOULD I SEEK IMMEDIATE MEDICAL CARE? °Seek immediate medical care if: °· Your contractions become stronger, more regular, and closer together.   °· You have fluid leaking or gushing from your vagina.   °· You have a fever.   °· You pass blood-tinged mucus.   °· You have vaginal bleeding.   °· You have continuous abdominal pain.   °· You have low back pain that you never had before.   °· You feel your baby's head pushing down and causing pelvic pressure.   °· Your baby is not moving as much as it used to.   °This information is not intended to replace advice given to you by your health care provider. Make sure you discuss any questions you have with your health care   provider. °Document Released: 05/25/2005 Document Revised: 09/16/2015 Document Reviewed: 03/06/2013 °Elsevier Interactive Patient Education © 2017 [employer] Inc. ° °

## 2016-07-03 NOTE — MAU Note (Addendum)
Having pain in lower abd and back for 2-3 days. Pelvic pressure. Leaking fld today that is clear. 1cm last sve. Had appt today in  Clinic but could not keep it so rescheduled for Monday

## 2016-07-03 NOTE — MAU Note (Signed)
PT  SAYS  STRONG UC  SINCE 130PM-  WHILE  SHE WAS IN CAR - SHE FELT  A GUSH  OF  FLUID    COME  OUT    AND  ALSO HAS  MUCUS - HAS AN ODOR.     PNC-  AT  CLINIC  -   VE  LAST WED-     1   CM .   DENIES HSV AND MRSA.    GBS-   UNSURE.

## 2016-07-04 LAB — CFVANTAGE CYSTIC FIB EXP SCREEN

## 2016-07-06 ENCOUNTER — Encounter: Payer: Medicaid Other | Admitting: Obstetrics and Gynecology

## 2016-07-07 ENCOUNTER — Telehealth (HOSPITAL_COMMUNITY): Payer: Self-pay | Admitting: *Deleted

## 2016-07-07 ENCOUNTER — Encounter: Payer: Self-pay | Admitting: General Practice

## 2016-07-07 ENCOUNTER — Ambulatory Visit (INDEPENDENT_AMBULATORY_CARE_PROVIDER_SITE_OTHER): Payer: Medicaid Other | Admitting: Student

## 2016-07-07 ENCOUNTER — Encounter (HOSPITAL_COMMUNITY): Payer: Self-pay | Admitting: *Deleted

## 2016-07-07 VITALS — BP 120/58 | HR 91 | Wt 212.1 lb

## 2016-07-07 DIAGNOSIS — O99013 Anemia complicating pregnancy, third trimester: Secondary | ICD-10-CM | POA: Diagnosis not present

## 2016-07-07 DIAGNOSIS — O09899 Supervision of other high risk pregnancies, unspecified trimester: Secondary | ICD-10-CM

## 2016-07-07 DIAGNOSIS — O0933 Supervision of pregnancy with insufficient antenatal care, third trimester: Secondary | ICD-10-CM

## 2016-07-07 DIAGNOSIS — O99019 Anemia complicating pregnancy, unspecified trimester: Secondary | ICD-10-CM

## 2016-07-07 DIAGNOSIS — D649 Anemia, unspecified: Secondary | ICD-10-CM

## 2016-07-07 NOTE — Telephone Encounter (Signed)
Preadmission screen  

## 2016-07-07 NOTE — Patient Instructions (Signed)
Braxton Hicks Contractions °Contractions of the uterus can occur throughout pregnancy. Contractions are not always a sign that you are in labor.  °WHAT ARE BRAXTON HICKS CONTRACTIONS?  °Contractions that occur before labor are called Braxton Hicks contractions, or false labor. Toward the end of pregnancy (32-34 weeks), these contractions can develop more often and may become more forceful. This is not true labor because these contractions do not result in opening (dilatation) and thinning of the cervix. They are sometimes difficult to tell apart from true labor because these contractions can be forceful and people have different pain tolerances. You should not feel embarrassed if you go to the hospital with false labor. Sometimes, the only way to tell if you are in true labor is for your health care provider to look for changes in the cervix. °If there are no prenatal problems or other health problems associated with the pregnancy, it is completely safe to be sent home with false labor and await the onset of true labor. °HOW CAN YOU TELL THE DIFFERENCE BETWEEN TRUE AND FALSE LABOR? °False Labor  °· The contractions of false labor are usually shorter and not as hard as those of true labor.   °· The contractions are usually irregular.   °· The contractions are often felt in the front of the lower abdomen and in the groin.   °· The contractions may go away when you walk around or change positions while lying down.   °· The contractions get weaker and are shorter lasting as time goes on.   °· The contractions do not usually become progressively stronger, regular, and closer together as with true labor.   °True Labor  °· Contractions in true labor last 30-70 seconds, become very regular, usually become more intense, and increase in frequency.   °· The contractions do not go away with walking.   °· The discomfort is usually felt in the top of the uterus and spreads to the lower abdomen and low back.   °· True labor can be  determined by your health care provider with an exam. This will show that the cervix is dilating and getting thinner.   °WHAT TO REMEMBER °· Keep up with your usual exercises and follow other instructions given by your health care provider.   °· Take medicines as directed by your health care provider.   °· Keep your regular prenatal appointments.   °· Eat and drink lightly if you think you are going into labor.   °· If Braxton Hicks contractions are making you uncomfortable:   °¨ Change your position from lying down or resting to walking, or from walking to resting.   °¨ Sit and rest in a tub of warm water.   °¨ Drink 2-3 glasses of water. Dehydration may cause these contractions.   °¨ Do slow and deep breathing several times an hour.   °WHEN SHOULD I SEEK IMMEDIATE MEDICAL CARE? °Seek immediate medical care if: °· Your contractions become stronger, more regular, and closer together.   °· You have fluid leaking or gushing from your vagina.   °· You have a fever.   °· You pass blood-tinged mucus.   °· You have vaginal bleeding.   °· You have continuous abdominal pain.   °· You have low back pain that you never had before.   °· You feel your baby's head pushing down and causing pelvic pressure.   °· Your baby is not moving as much as it used to.   °This information is not intended to replace advice given to you by your health care provider. Make sure you discuss any questions you have with your health care   provider. °Document Released: 05/25/2005 Document Revised: 09/16/2015 Document Reviewed: 03/06/2013 °Elsevier Interactive Patient Education © 2017 [employer] ° °

## 2016-07-07 NOTE — Progress Notes (Signed)
   PRENATAL VISIT NOTE  Subjective:  Maria Bruce is a 26 y.o. [phone] at 3561w5d being seen today for ongoing prenatal care.  She is currently monitored for the following issues for this low-risk pregnancy and has Supervision of other high risk pregnancy, antepartum; Limited prenatal care in third trimester; and Anemia during pregnancy on her problem list.  Patient reports no complaints.  Contractions: Not present. Vag. Bleeding: None.  Movement: Present. Denies leaking of fluid.   The following portions of the patient's history were reviewed and updated as appropriate: allergies, current medications, past family history, past medical history, past social history, past surgical history and problem list. Problem list updated.  Objective:   Vitals:   07/07/16 1259  BP: (!) 120/58  Pulse: 91  Weight: 212 lb 1.6 oz (96.2 kg)    Fetal Status: Fetal Heart Rate (bpm): 152 Fundal Height: 38 cm Movement: Present  Presentation: Vertex  General:  Alert, oriented and cooperative. Patient is in no acute distress.  Skin: Skin is warm and dry. No rash noted.   Cardiovascular: Normal heart rate noted  Respiratory: Normal respiratory effort, no problems with respiration noted  Abdomen: Soft, gravid, appropriate for gestational age. Pain/Pressure: Present     Pelvic:  Cervical exam deferred        Extremities: Normal range of motion.  Edema: None  Mental Status: Normal mood and affect. Normal behavior. Normal judgment and thought content.   Assessment and Plan:  Pregnancy: [id number] at 5761w5d  1. Anemia during pregnancy Patient is continuing with her iron pills as prescribed.   2. Limited prenatal care in third trimester   3. Supervision of other high risk pregnancy, antepartum Patient has no complaints; returning on Friday, 07/10/2016 for NST and AFI. Prenatal visit on 07/14/2016.  Induction scheduled for 07/16/2016 at 41 weeks.   Term labor symptoms and general obstetric precautions  including but not limited to vaginal bleeding, contractions, leaking of fluid and fetal movement were reviewed in detail with the patient. Please refer to After Visit Summary for other counseling recommendations.  Return in about 1 week (around 07/14/2016).   Marylene LandKathryn Lorraine Darlys Buis, CNM

## 2016-07-10 ENCOUNTER — Other Ambulatory Visit: Payer: Medicaid Other | Admitting: Obstetrics & Gynecology

## 2016-07-14 ENCOUNTER — Encounter: Payer: Self-pay | Admitting: Advanced Practice Midwife

## 2016-07-14 ENCOUNTER — Ambulatory Visit (INDEPENDENT_AMBULATORY_CARE_PROVIDER_SITE_OTHER): Payer: Medicaid Other | Admitting: Advanced Practice Midwife

## 2016-07-14 ENCOUNTER — Ambulatory Visit: Payer: Self-pay

## 2016-07-14 VITALS — BP 111/63 | HR 107 | Wt 218.0 lb

## 2016-07-14 DIAGNOSIS — Z3689 Encounter for other specified antenatal screening: Secondary | ICD-10-CM | POA: Diagnosis not present

## 2016-07-14 DIAGNOSIS — O09899 Supervision of other high risk pregnancies, unspecified trimester: Secondary | ICD-10-CM

## 2016-07-14 DIAGNOSIS — O480 Post-term pregnancy: Secondary | ICD-10-CM | POA: Insufficient documentation

## 2016-07-14 NOTE — Progress Notes (Signed)
   PRENATAL VISIT NOTE  Subjective:  Maria Bruce is a 26 y.o. [phone] at 2376w5d being seen today for ongoing prenatal care.  She is currently monitored for the following issues for this low-risk pregnancy and has Supervision of other high risk pregnancy, antepartum; Limited prenatal care in third trimester; Anemia during pregnancy; and Post term pregnancy on her problem list.  Patient reports no complaints.  Contractions: Not present. Vag. Bleeding: None.  Movement: Present. Denies leaking of fluid.   The following portions of the patient's history were reviewed and updated as appropriate: allergies, current medications, past family history, past medical history, past social history, past surgical history and problem list. Problem list updated.  Objective:   Vitals:   07/14/16 1026  BP: 111/63  Pulse: (!) 107  Weight: 218 lb (98.9 kg)    Fetal Status: Fetal Heart Rate (bpm): NST   Movement: Present     General:  Alert, oriented and cooperative. Patient is in no acute distress.  Skin: Skin is warm and dry. No rash noted.   Cardiovascular: Normal heart rate noted  Respiratory: Normal respiratory effort, no problems with respiration noted  Abdomen: Soft, gravid, appropriate for gestational age. Pain/Pressure: Present     Pelvic:  Cervical exam deferred        Extremities: Normal range of motion.  Edema: None  Mental Status: Normal mood and affect. Normal behavior. Normal judgment and thought content.   Assessment and Plan:  Pregnancy: G5P4004 at 6776w5d  1. Post term pregnancy, antepartum condition or complication      NST reactive - Fetal nonstress test - US OB Limited  2. Supervision of other high risk pregnancy, antepartum   3. Post-term pregnancy, 40-42 weeks of gestation     Plan IOL for postterm Thursday 2/8 with me      Likely plan Foley then Pitocin  Term labor symptoms and general obstetric precautions including but not limited to vaginal bleeding,  contractions, leaking of fluid and fetal movement were reviewed in detail with the patient. Please refer to After Visit Summary for other counseling recommendations.  Return in about 6 weeks (around 08/25/2016) for PP visit.   Aviva SignsMarie L Kathren Scearce, CNM

## 2016-07-14 NOTE — Progress Notes (Addendum)
Pt informed that the ultrasound is considered a limited OB ultrasound and is not intended to be a complete ultrasound exam.  Patient also informed that the ultrasound is not being completed with the intent of assessing for fetal or placental anomalies or any pelvic abnormalities.  Explained that the purpose of today's ultrasound is to assess for presentation and amniotic fluid volume.  Patient acknowledges the purpose of the exam and the limitations of the study.    IOL scheduled 2/8 @ 0730

## 2016-07-14 NOTE — Patient Instructions (Signed)

## 2016-07-16 ENCOUNTER — Inpatient Hospital Stay (HOSPITAL_COMMUNITY): Payer: Medicaid Other | Admitting: Anesthesiology

## 2016-07-16 ENCOUNTER — Encounter (HOSPITAL_COMMUNITY): Payer: Self-pay

## 2016-07-16 ENCOUNTER — Inpatient Hospital Stay (HOSPITAL_COMMUNITY)
Admission: RE | Admit: 2016-07-16 | Discharge: 2016-07-18 | DRG: 775 | Disposition: A | Discharge status: Home / Self Care | Payer: Medicaid Other | Source: Ambulatory Visit | Attending: Obstetrics and Gynecology | Admitting: Obstetrics and Gynecology

## 2016-07-16 DIAGNOSIS — Z8249 Family history of ischemic heart disease and other diseases of the circulatory system: Secondary | ICD-10-CM

## 2016-07-16 DIAGNOSIS — Z6837 Body mass index (BMI) 37.0-37.9, adult: Secondary | ICD-10-CM

## 2016-07-16 DIAGNOSIS — O9902 Anemia complicating childbirth: Secondary | ICD-10-CM | POA: Diagnosis present

## 2016-07-16 DIAGNOSIS — Z3A41 41 weeks gestation of pregnancy: Secondary | ICD-10-CM | POA: Diagnosis not present

## 2016-07-16 DIAGNOSIS — O99214 Obesity complicating childbirth: Secondary | ICD-10-CM | POA: Diagnosis present

## 2016-07-16 DIAGNOSIS — D649 Anemia, unspecified: Secondary | ICD-10-CM | POA: Diagnosis present

## 2016-07-16 DIAGNOSIS — O480 Post-term pregnancy: Secondary | ICD-10-CM | POA: Diagnosis present

## 2016-07-16 DIAGNOSIS — Z833 Family history of diabetes mellitus: Secondary | ICD-10-CM | POA: Diagnosis not present

## 2016-07-16 LAB — CBC
HEMATOCRIT: 25.5 % — AB (ref 36.0–46.0)
Hemoglobin: 8 g/dL — ABNORMAL LOW (ref 12.0–15.0)
MCH: 22.8 pg — ABNORMAL LOW (ref 26.0–34.0)
MCHC: 31.4 g/dL (ref 30.0–36.0)
MCV: 72.6 fL — ABNORMAL LOW (ref 78.0–100.0)
Platelets: 211 10*3/uL (ref 150–400)
RBC: 3.51 MIL/uL — AB (ref 3.87–5.11)
RDW: 16.2 % — ABNORMAL HIGH (ref 11.5–15.5)
WBC: 8.2 10*3/uL (ref 4.0–10.5)

## 2016-07-16 LAB — PREPARE RBC (CROSSMATCH)

## 2016-07-16 MED ORDER — SENNOSIDES-DOCUSATE SODIUM 8.6-50 MG PO TABS
2.0000 | ORAL_TABLET | ORAL | Status: DC
  Administered 2016-07-17 (×2): 2 via ORAL
  Filled 2016-07-16 (×2): qty 2

## 2016-07-16 MED ORDER — WITCH HAZEL-GLYCERIN EX PADS: 1.0000 "application " | MEDICATED_PAD | CUTANEOUS | Status: DC | PRN

## 2016-07-16 MED ORDER — TETANUS-DIPHTH-ACELL PERTUSSIS 5-2.5-18.5 LF-MCG/0.5 IM SUSP: 0.5000 mL | Freq: Once | INTRAMUSCULAR | Status: DC

## 2016-07-16 MED ORDER — OXYCODONE-ACETAMINOPHEN 5-325 MG PO TABS: 1.0000 | ORAL_TABLET | ORAL | Status: DC | PRN

## 2016-07-16 MED ORDER — DIBUCAINE 1 % RE OINT: 1.0000 "application " | TOPICAL_OINTMENT | RECTAL | Status: DC | PRN

## 2016-07-16 MED ORDER — IBUPROFEN 600 MG PO TABS
600.0000 mg | ORAL_TABLET | Freq: Four times a day (QID) | ORAL | Status: DC
  Administered 2016-07-17 – 2016-07-18 (×7): 600 mg via ORAL
  Filled 2016-07-16 (×7): qty 1

## 2016-07-16 MED ORDER — FLEET ENEMA 7-19 GM/118ML RE ENEM: 1.0000 | ENEMA | RECTAL | Status: DC | PRN

## 2016-07-16 MED ORDER — DIPHENHYDRAMINE HCL 50 MG/ML IJ SOLN: 12.5000 mg | INTRAMUSCULAR | Status: DC | PRN

## 2016-07-16 MED ORDER — BENZOCAINE-MENTHOL 20-0.5 % EX AERO
1.0000 "application " | INHALATION_SPRAY | CUTANEOUS | Status: DC | PRN
  Filled 2016-07-16: qty 56

## 2016-07-16 MED ORDER — FENTANYL 2.5 MCG/ML BUPIVACAINE 1/10 % EPIDURAL INFUSION (WH - ANES)
14.0000 mL/h | INTRAMUSCULAR | Status: DC | PRN
  Administered 2016-07-16: 14 mL/h via EPIDURAL
  Filled 2016-07-16: qty 100

## 2016-07-16 MED ORDER — FENTANYL CITRATE (PF) 100 MCG/2ML IJ SOLN
50.0000 ug | INTRAMUSCULAR | Status: DC | PRN
  Administered 2016-07-16: 50 ug via INTRAVENOUS
  Filled 2016-07-16: qty 2

## 2016-07-16 MED ORDER — ONDANSETRON HCL 4 MG/2ML IJ SOLN: 4.0000 mg | INTRAMUSCULAR | Status: DC | PRN

## 2016-07-16 MED ORDER — OXYTOCIN 40 UNITS IN LACTATED RINGERS INFUSION - SIMPLE MED: 2.5000 [IU]/h | INTRAVENOUS | Status: DC

## 2016-07-16 MED ORDER — ACETAMINOPHEN 325 MG PO TABS: 650.0000 mg | ORAL_TABLET | ORAL | Status: DC | PRN

## 2016-07-16 MED ORDER — ZOLPIDEM TARTRATE 5 MG PO TABS: 5.0000 mg | ORAL_TABLET | Freq: Every evening | ORAL | Status: DC | PRN

## 2016-07-16 MED ORDER — ONDANSETRON HCL 4 MG/2ML IJ SOLN: 4.0000 mg | Freq: Four times a day (QID) | INTRAMUSCULAR | Status: DC | PRN

## 2016-07-16 MED ORDER — LACTATED RINGERS IV SOLN: 500.0000 mL | INTRAVENOUS | Status: DC | PRN

## 2016-07-16 MED ORDER — DIPHENHYDRAMINE HCL 25 MG PO CAPS: 25.0000 mg | ORAL_CAPSULE | Freq: Four times a day (QID) | ORAL | Status: DC | PRN

## 2016-07-16 MED ORDER — PHENYLEPHRINE 40 MCG/ML (10ML) SYRINGE FOR IV PUSH (FOR BLOOD PRESSURE SUPPORT)
80.0000 ug | PREFILLED_SYRINGE | INTRAVENOUS | Status: DC | PRN
  Filled 2016-07-16: qty 5
  Filled 2016-07-16: qty 10

## 2016-07-16 MED ORDER — OXYTOCIN BOLUS FROM INFUSION
500.0000 mL | Freq: Once | INTRAVENOUS | Status: AC
  Administered 2016-07-16: 500 mL via INTRAVENOUS

## 2016-07-16 MED ORDER — LIDOCAINE HCL (PF) 1 % IJ SOLN
30.0000 mL | INTRAMUSCULAR | Status: DC | PRN
  Filled 2016-07-16: qty 30

## 2016-07-16 MED ORDER — PRENATAL MULTIVITAMIN CH
1.0000 | ORAL_TABLET | Freq: Every day | ORAL | Status: DC
  Administered 2016-07-17 – 2016-07-18 (×2): 1 via ORAL
  Filled 2016-07-16 (×2): qty 1

## 2016-07-16 MED ORDER — SOD CITRATE-CITRIC ACID 500-334 MG/5ML PO SOLN: 30.0000 mL | ORAL | Status: DC | PRN

## 2016-07-16 MED ORDER — EPHEDRINE 5 MG/ML INJ
10.0000 mg | INTRAVENOUS | Status: DC | PRN
  Filled 2016-07-16: qty 4

## 2016-07-16 MED ORDER — COCONUT OIL OIL: 1.0000 "application " | TOPICAL_OIL | Status: DC | PRN

## 2016-07-16 MED ORDER — ACETAMINOPHEN 325 MG PO TABS
650.0000 mg | ORAL_TABLET | ORAL | Status: DC | PRN
  Administered 2016-07-17 (×2): 650 mg via ORAL
  Filled 2016-07-16 (×2): qty 2

## 2016-07-16 MED ORDER — TERBUTALINE SULFATE 1 MG/ML IJ SOLN
0.2500 mg | Freq: Once | INTRAMUSCULAR | Status: DC | PRN
  Filled 2016-07-16: qty 1

## 2016-07-16 MED ORDER — PHENYLEPHRINE 40 MCG/ML (10ML) SYRINGE FOR IV PUSH (FOR BLOOD PRESSURE SUPPORT)
80.0000 ug | PREFILLED_SYRINGE | INTRAVENOUS | Status: DC | PRN
  Filled 2016-07-16: qty 5

## 2016-07-16 MED ORDER — OXYCODONE-ACETAMINOPHEN 5-325 MG PO TABS: 2.0000 | ORAL_TABLET | ORAL | Status: DC | PRN

## 2016-07-16 MED ORDER — LIDOCAINE HCL (PF) 1 % IJ SOLN
INTRAMUSCULAR | Status: DC | PRN
  Administered 2016-07-16 (×2): 7 mL via EPIDURAL

## 2016-07-16 MED ORDER — ONDANSETRON HCL 4 MG PO TABS: 4.0000 mg | ORAL_TABLET | ORAL | Status: DC | PRN

## 2016-07-16 MED ORDER — LACTATED RINGERS IV SOLN
500.0000 mL | Freq: Once | INTRAVENOUS | Status: AC
  Administered 2016-07-16: 500 mL via INTRAVENOUS

## 2016-07-16 MED ORDER — SIMETHICONE 80 MG PO CHEW: 80.0000 mg | CHEWABLE_TABLET | ORAL | Status: DC | PRN

## 2016-07-16 MED ORDER — OXYTOCIN 40 UNITS IN LACTATED RINGERS INFUSION - SIMPLE MED
1.0000 m[IU]/min | INTRAVENOUS | Status: DC
  Administered 2016-07-16: 2 m[IU]/min via INTRAVENOUS
  Filled 2016-07-16: qty 1000

## 2016-07-16 MED ORDER — LACTATED RINGERS IV SOLN
INTRAVENOUS | Status: DC
  Administered 2016-07-16 (×2): via INTRAVENOUS

## 2016-07-16 NOTE — Progress Notes (Signed)
Patient ID: Maria Bruce, female   DOB: 1991-02-22, 26 y.o.   MRN: [id number] IOL for postdates  Vitals:   07/16/16 0759 07/16/16 0805  BP:  126/66  Pulse:  95  Resp:  16  Temp:  98.8 F (37.1 C)  TempSrc:  Oral  SpO2:  99%  Weight: 218 lb (98.9 kg)   Height: 5\' 4"  (1.626 m)    FHR reactive  Occasional UCs  Foley inserted by resident Will start Pitocin  Dilation: 1.5 Effacement (%): 20 Station: -3 Presentation: Vertex Exam by:: Serafina Royalsan Warden, MD  Aviva SignsMarie L Grafton Warzecha, CNM

## 2016-07-16 NOTE — Anesthesia Preprocedure Evaluation (Signed)
Anesthesia Evaluation  Patient identified by MRN, date of birth, ID band Patient awake    Reviewed: Allergy & Precautions, H&P , NPO status , Patient's Chart, lab work & pertinent test results  Airway Mallampati: II  TM Distance: >3 FB Neck ROM: full    Dental no notable dental hx.    Pulmonary asthma ,    Pulmonary exam normal        Cardiovascular negative cardio ROS Normal cardiovascular exam     Neuro/Psych negative neurological ROS  negative psych ROS   GI/Hepatic negative GI ROS, Neg liver ROS,   Endo/Other  negative endocrine ROSMorbid obesity  Renal/GU negative Renal ROS  negative genitourinary   Musculoskeletal   Abdominal (+) + obese,   Peds  Hematology negative hematology ROS (+) anemia ,   Anesthesia Other Findings   Reproductive/Obstetrics (+) Pregnancy                             Anesthesia Physical  Anesthesia Plan  ASA: II  Anesthesia Plan: Epidural   Post-op Pain Management:    Induction:   Airway Management Planned: Natural Airway  Additional Equipment:   Intra-op Plan:   Post-operative Plan:   Informed Consent: I have reviewed the patients History and Physical, chart, labs and discussed the procedure including the risks, benefits and alternatives for the proposed anesthesia with the patient or authorized representative who has indicated his/her understanding and acceptance.     Plan Discussed with:   Anesthesia Plan Comments:         Anesthesia Quick Evaluation

## 2016-07-16 NOTE — H&P (Signed)
LABOR AND DELIVERY ADMISSION HISTORY AND PHYSICAL NOTE  Maria Bruce is a 26 y.o. female [phone] with IUP at 6022w0d by LMP presenting for IOL for postdates.  She denies any HA, changes in vision, NVD, CP or SOB.  She reports positive fetal movement. She denies leakage of fluid or vaginal bleeding.  Prenatal History/Complications:  Past Medical History: Past Medical History:  Diagnosis Date  . Asthma    as a child    Past Surgical History: Past Surgical History:  Procedure Laterality Date  . NO PAST SURGERIES      Obstetrical History: OB History    Gravida Para Term Preterm AB Living   5 4 4  0 0 4   SAB TAB Ectopic Multiple Live Births   0 0 0 0 4      Social History: Social History   Social History  . Marital status: Single    Spouse name: N/A  . Number of children: N/A  . Years of education: N/A   Social History Main Topics  . Smoking status: Never Smoker  . Smokeless tobacco: Never Used  . Alcohol use No  . Drug use: No  . Sexual activity: Yes    Birth control/ protection: None   Other Topics Concern  . None   Social History Narrative  . None    Family History: Family History  Problem Relation Age of Onset  . Heart attack Mother   . Hypertension Paternal Grandmother   . Diabetes Paternal Grandmother     Allergies: No Known Allergies  Prescriptions Prior to Admission  Medication Sig Dispense Refill Last Dose  . Prenatal Vit-Fe Fumarate-FA (PRENATAL MULTIVITAMIN) TABS tablet Take 1 tablet by mouth daily at 12 noon.   07/15/2016 at Unknown time     Review of Systems   All systems reviewed and negative except as stated in HPI  Blood pressure 126/66, pulse 95, temperature 98.8 F (37.1 C), temperature source Oral, resp. rate 16, height 5\' 4"  (1.626 m), weight 98.9 kg (218 lb), SpO2 99 %, not currently breastfeeding. General appearance: alert and cooperative Lungs: clear to auscultation bilaterally Heart: regular rate and  rhythm Abdomen: soft, non-tender; bowel sounds normal Extremities: No calf swelling or tenderness Presentation: cephalic Fetal monitoring: HR baseline 135, mod variability, pos accels, no decels, occassional contractions.  Uterine activity:  Dilation: 1.5 Effacement (%): 20 Station: -3 Exam by:: Serafina Royalsan Warden, MD   Prenatal labs: ABO, Rh: B/POS/-- (01/16 1115) Antibody: NEG (01/16 1115) Rubella: Immune RPR: NON REAC (01/16 1115)  HBsAg: NEGATIVE (01/16 1115)  HIV: NONREACTIVE (01/16 1115)  GBS: Negative (01/16 0000)  1 hr Glucola: third: normal Genetic screening:   Too late Anatomy US: normal, but incomplete 2/2 late GA  Prenatal Transfer Tool  Maternal Diabetes: No Genetic Screening: Normal Maternal Ultrasounds/Referrals: Normal Fetal Ultrasounds or other Referrals:  None Maternal Substance Abuse:  No Significant Maternal Medications:  None Significant Maternal Lab Results: None  Results for orders placed or performed during the hospital encounter of 07/16/16 (from the past 24 hour(s))  CBC   Collection Time: 07/16/16  9:00 AM  Result Value Ref Range   WBC 8.2 4.0 - 10.5 K/uL   RBC 3.51 (L) 3.87 - 5.11 MIL/uL   Hemoglobin 8.0 (L) 12.0 - 15.0 g/dL   HCT 45.425.5 (L) 09.836.0 - 11.946.0 %   MCV 72.6 (L) 78.0 - 100.0 fL   MCH 22.8 (L) 26.0 - 34.0 pg   MCHC 31.4 30.0 - 36.0 g/dL  RDW 16.2 (H) 11.5 - 15.5 %   Platelets 211 150 - 400 K/uL    Patient Active Problem List   Diagnosis Date Noted  . Post-dates pregnancy 07/16/2016  . Post term pregnancy 07/14/2016  . Anemia during pregnancy 07/01/2016  . Supervision of other high risk pregnancy, antepartum 06/23/2016  . Limited prenatal care in third trimester 06/23/2016    Assessment: [name] is a 26 y.o. [id number] at [redacted]w[redacted]d here for IOL for postdates. Denies loss of fluids or vaginal bleeding.  Does have chronic anemia and does report taking iron, but does not have it listed on medication list. Hemoglobin to 8 this  AM.   #Labor: IOL for postdates, placed foley bulb and started pitocin 2x2 and anticipate SVD #Pain: epidural #FWB: Cat 1 #ID: GBS neg #MOF: Breast #MOC: IUD #Circ: no circ if boy  Renne Musca, MD PGY-1 07/16/2016, 9:40 AM  The patient was seen and examined by me also Agree with note NST reactive and reassuring UCs as listed Cervical exams as listed in note  Aviva Signs, CNM

## 2016-07-16 NOTE — Plan of Care (Signed)
Problem: Safety: Goal: Ability to remain free from injury will improve Outcome: Progressing Pt is up ad lib at this time.   Problem: Pain Managment: Goal: General experience of comfort will improve Outcome: Progressing Pain assessed during assessment, pt is comfortable at this time. Will continue to monitor.   Problem: Education: Goal: Knowledge of Childbirth will improve Outcome: Progressing Pt has 4 children a home, this is first induction, pt states she is a little nervous, but understands process.   Problem: Coping: Goal: Ability to identify appropriate support needs for the childbearing family will improve Outcome: Progressing Pts SO is at home with other children, her family members will be here to be with her soon.  Goal: Ability to verbalize concerns and feelings about labor and delivery will improve Outcome: Progressing Pt expressing any concerns, RN addressing concerns. Pt comfortable.

## 2016-07-16 NOTE — Anesthesia Postprocedure Evaluation (Signed)
Anesthesia Post Note  Patient: Maria Bruce  Procedure(s) Performed: * No procedures listed *  Patient location during evaluation: Mother Baby Anesthesia Type: Epidural Level of consciousness: awake and alert Pain management: satisfactory to patient Vital Signs Assessment: post-procedure vital signs reviewed and stable Respiratory status: respiratory function stable Cardiovascular status: stable Postop Assessment: no headache, no backache, epidural receding, patient able to bend at knees, no signs of nausea or vomiting and adequate PO intake Anesthetic complications: no        Last Vitals:  Vitals:   07/16/16 1945 07/16/16 2015  BP: 112/71 107/61  Pulse: 72 70  Resp: 16 16  Temp:  36.5 C    Last Pain:  Vitals:   07/16/16 2015  TempSrc: Oral  PainSc:    Pain Goal:                 [name]

## 2016-07-16 NOTE — Anesthesia Pain Management Evaluation Note (Signed)
  CRNA Pain Management Visit Note  Patient: Maria Bruce, 26 y.o., female  "Hello I am a [facility] the [facility]. We have an anesthesia team available at all times to provide care throughout the hospital, including epidural management and anesthesia for C-section. I don't know your plan for the delivery whether it a natural birth, water birth, IV sedation, nitrous supplementation, doula or epidural, but we want to meet your pain goals."   1.Was your pain managed to your expectations on prior hospitalizations?   Yes   2.What is your expectation for pain management during this hospitalization?     Epidural  3.How can we help you reach that goal?   Record the patient's initial score and the patient's pain goal.   Pain: 10  Pain Goal: 3 The [facility] wants you to be able to say your pain was always managed very well.  Laban EmperorMalinova,Pansie Guggisberg Hristova 07/16/2016

## 2016-07-16 NOTE — Anesthesia Procedure Notes (Signed)
Epidural Patient location during procedure: OB Start time: 07/16/2016 12:34 PM End time: 07/16/2016 12:40 PM  Staffing Anesthesiologist: Leilani AbleHATCHETT, Gayatri Teasdale Performed: anesthesiologist   Preanesthetic Checklist Completed: patient identified, surgical consent, pre-op evaluation, timeout performed, IV checked, risks and benefits discussed and monitors and equipment checked  Epidural Patient position: sitting Prep: site prepped and draped and DuraPrep Patient monitoring: continuous pulse ox and blood pressure Approach: midline Location: L3-L4 Injection technique: LOR air  Needle:  Needle type: Tuohy  Needle gauge: 17 G Needle length: 9 cm and 9 Needle insertion depth: 6 cm Catheter type: closed end flexible Catheter size: 19 Gauge Catheter at skin depth: 11 cm Test dose: negative and Other  Assessment Sensory level: T9 Events: blood not aspirated, injection not painful, no injection resistance, negative IV test and no paresthesia  Additional Notes Reason for block:procedure for pain

## 2016-07-17 LAB — RPR: RPR: NONREACTIVE

## 2016-07-17 MED ORDER — POLYSACCHARIDE IRON COMPLEX 150 MG PO CAPS
150.0000 mg | ORAL_CAPSULE | Freq: Two times a day (BID) | ORAL | Status: DC
  Administered 2016-07-17 – 2016-07-18 (×3): 150 mg via ORAL
  Filled 2016-07-17 (×3): qty 1

## 2016-07-17 MED ORDER — OXYCODONE HCL 5 MG PO TABS
5.0000 mg | ORAL_TABLET | ORAL | Status: DC | PRN
  Administered 2016-07-17 (×3): 5 mg via ORAL
  Filled 2016-07-17 (×3): qty 1

## 2016-07-17 NOTE — Progress Notes (Signed)
UR chart review completed.  

## 2016-07-17 NOTE — Clinical Social Work Maternal (Signed)
  CLINICAL SOCIAL WORK MATERNAL/CHILD NOTE  Patient Details  Name: Maria Bruce MRN: [id number] Date of Birth: [birth year]  Date:  07/17/2016  Clinical Social Worker Initiating Note:  Laurey Arrow Date/ Time Initiated:  07/17/16/1000     Child's Name:  Maria Bruce   Legal Guardian:  Mother (FOB is Kara Dies)   Need for Interpreter:  None   Date of Referral:  07/17/16     Reason for Referral:  Late or No Prenatal Care    Referral Source:  Central Nursery   Address:  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.  When CSW arrived, MOB was resting in bed engaging in skin to skin infant.  MOB's friend was reclined in chair watching TV.  MOB gave CSW permission to meet with MOB while MOB's friend was present.  MOB was polite, engaged, and interested in meeting with CSW.  CSW inquired about MOB's late/limited PNC.  MOB reported that MOB was in denial about being pregnant and was embarrassed to share her pregnancy due to fear of being judged.  CSW assured MOB that medical providers will not judge MOB and expressed the importance of prenatal and postanal care. MOB denied barriers to  follow-up appointments and denied all other psychosocial stressors.  CSW informed MOB of the hospital's drug screen policy, and informed MOB of the 2 screenings for the infant. MOB was not concerned and again expressed not utilizing any substance. CSW shared with MOB that the infant's UDS was negative and CSW will continue to monitor the infant's CDS.  CSW was made aware that if the CDS are positive, CSW will make a report to [place].  MOB did not have any questions regarding the hospital's policy and communicated that she experience this with MOB's previous pregnancies. MOB also denied a hx of CPS involvement. MOB denied a hx of PPD and DV. CSW thanked MOB for meeting with CSW and provided MOB with CSW contact information. CSW Plan/Description:  Patient/Family Education , No Further Intervention Required/No Barriers to Discharge (CSW will monitor infant's CDS and will make a CPS reported if needed. )   Laurey Arrow, MSW, LCSW Clinical Social Work [phone]   Dimple Nanas, LCSW 07/17/2016, 3:28 PM

## 2016-07-17 NOTE — Progress Notes (Signed)
Post Partum Day #1 Subjective: no complaints, up ad lib, voiding and tolerating PO  Objective: Blood pressure (!) 114/57, pulse 76, temperature 98.7 F (37.1 C), temperature source Oral, resp. rate 16, height 5\' 4"  (1.626 m), weight 218 lb (98.9 kg), SpO2 100 %, unknown if currently breastfeeding.  Physical Exam:  General: alert, cooperative and no distress Lochia: appropriate Uterine Fundus: firm Incision: no significant drainage, no dehiscence, no significant erythema, 1st deg lac.  DVT Evaluation: No evidence of DVT seen on physical exam. No cords or calf tenderness. No significant calf/ankle edema.   Recent Labs  07/16/16 0900  HGB 8.0*  HCT 25.5*    Assessment/Plan: Plan for discharge tomorrow, Social Work consult and Contraception Nexplanon planned   LOS: 1 day   Nucor Corporationachelle A Denney, CNM 07/17/2016, 8:16 AM

## 2016-07-18 LAB — CBC WITH DIFFERENTIAL/PLATELET
BASOS ABS: 0 10*3/uL (ref 0.0–0.1)
BASOS PCT: 0 %
EOS ABS: 0.3 10*3/uL (ref 0.0–0.7)
Eosinophils Relative: 3 %
HCT: 25.5 % — ABNORMAL LOW (ref 36.0–46.0)
Hemoglobin: 7.7 g/dL — ABNORMAL LOW (ref 12.0–15.0)
Lymphocytes Relative: 25 %
Lymphs Abs: 2.6 10*3/uL (ref 0.7–4.0)
MCH: 22.3 pg — ABNORMAL LOW (ref 26.0–34.0)
MCHC: 30.2 g/dL (ref 30.0–36.0)
MCV: 73.7 fL — ABNORMAL LOW (ref 78.0–100.0)
MONO ABS: 0.4 10*3/uL (ref 0.1–1.0)
MONOS PCT: 4 %
Neutro Abs: 6.7 10*3/uL (ref 1.7–7.7)
Neutrophils Relative %: 67 %
PLATELETS: 204 10*3/uL (ref 150–400)
RBC: 3.46 MIL/uL — ABNORMAL LOW (ref 3.87–5.11)
RDW: 16.4 % — AB (ref 11.5–15.5)
WBC: 10.1 10*3/uL (ref 4.0–10.5)

## 2016-07-18 MED ORDER — IBUPROFEN 600 MG PO TABS: 600.0000 mg | ORAL_TABLET | Freq: Four times a day (QID) | ORAL | 0 refills | Status: DC | PRN

## 2016-07-18 MED ORDER — POLYSACCHARIDE IRON COMPLEX 150 MG PO CAPS: 150.0000 mg | ORAL_CAPSULE | Freq: Two times a day (BID) | ORAL | 3 refills | Status: DC

## 2016-07-18 NOTE — Discharge Summary (Signed)
OB Discharge Summary     Patient Name: Maria Bruce DOB: Sep 30, 1990 MRN: [id number]  Date of admission: 07/16/2016 Delivering MD: Killona Bing   Date of discharge: 07/18/2016  Admitting diagnosis: INDUCTION Intrauterine pregnancy: [redacted]w[redacted]d     Secondary diagnosis:  Active Problems:   Post-dates pregnancy Anemia  Additional problems: none     Discharge diagnosis: Term Pregnancy Delivered and Anemia                                                                                                Post partum procedures:none  Augmentation: AROM, Pitocin and Foley Balloon  Complications: None  Hospital course:  Induction of Labor With Vaginal Delivery   26 y.o. yo G5P5005 at [redacted]w[redacted]d was admitted to the hospital 07/16/2016 for induction of labor.  Indication for induction: Postdates.  Patient had an uncomplicated labor course as follows: Membrane Rupture Time/Date: 3:25 PM ,07/16/2016   Intrapartum Procedures: Episiotomy: None [1]                                         Lacerations:  1st degree [2]  Patient had delivery of a Viable infant.  Information for the patient's newborn:  Rosana Berger [[id number]]  Delivery Method: Vaginal, Spontaneous Delivery (Filed from Delivery Summary)   07/16/2016  Details of delivery can be found in separate delivery note.  Patient had a routine postpartum course. Patient is discharged home 07/18/16.  Physical exam  Vitals:   07/17/16 1418 07/17/16 1442 07/17/16 1700 07/18/16 0559  BP: 91/61 112/63 116/69 114/60  Pulse: 72 73 73 73  Resp: 14 16 16 18   Temp: 98.1 F (36.7 C)  98.4 F (36.9 C) 97.9 F (36.6 C)  TempSrc: Oral  Oral Oral  SpO2:      Weight:      Height:       General: alert and cooperative Lochia: appropriate Uterine Fundus: firm Incision: N/A DVT Evaluation: No evidence of DVT seen on physical exam. Labs: Lab Results  Component Value Date   WBC 10.1 07/18/2016   HGB 7.7 (L) 07/18/2016   HCT 25.5 (L)  07/18/2016   MCV 73.7 (L) 07/18/2016   PLT 204 07/18/2016   CMP Latest Ref Rng & Units 04/29/2013  Glucose 70 - 99 mg/dL 82  BUN 6 - 23 mg/dL 3(L)  Creatinine 7.82 - 1.10 mg/dL 9.56(O)  Sodium 130 - 865 mEq/L 134(L)  Potassium 3.5 - 5.1 mEq/L 3.6  Chloride 96 - 112 mEq/L 99  CO2 19 - 32 mEq/L 23  Calcium 8.4 - 10.5 mg/dL 9.0    Discharge instruction: per After Visit Summary and "Baby and Me Booklet".  After visit meds:  Allergies as of 07/18/2016   No Known Allergies     Medication List    TAKE these medications   ibuprofen 600 MG tablet Commonly known as:  ADVIL,MOTRIN Take 1 tablet (600 mg total) by mouth every 6 (six) hours as needed.   iron polysaccharides 150 MG capsule  Commonly known as:  NIFEREX Take 1 capsule (150 mg total) by mouth 2 (two) times daily.   prenatal multivitamin Tabs tablet Take 1 tablet by mouth daily at 12 noon.       Diet: routine diet  Activity: Advance as tolerated. Pelvic rest for 6 weeks.   Outpatient follow up:6 weeks Follow up Appt:Future Appointments Date Time Provider Department Center  08/27/2016 10:20 AM Aviva SignsMarie L Williams, CNM [facility]   Follow up Visit:No Follow-up on file.  Postpartum contraception: Nexplanon  Newborn Data: Live born female  Birth Weight: 7 lb 3 oz (3260 g) APGAR: 9, 9  Baby Feeding: Bottle Disposition:home with mother   07/18/2016 Cam HaiSHAW, Rosalynn, CNM

## 2016-07-18 NOTE — Discharge Instructions (Signed)

## 2016-07-20 LAB — TYPE AND SCREEN
ABO/RH(D): B POS
ANTIBODY SCREEN: NEGATIVE
UNIT DIVISION: 0
Unit division: 0

## 2016-08-27 ENCOUNTER — Ambulatory Visit: Payer: Medicaid Other | Admitting: Advanced Practice Midwife

## 2016-09-17 ENCOUNTER — Ambulatory Visit (INDEPENDENT_AMBULATORY_CARE_PROVIDER_SITE_OTHER): Payer: Medicaid Other | Admitting: Advanced Practice Midwife

## 2016-09-17 ENCOUNTER — Encounter: Payer: Self-pay | Admitting: Advanced Practice Midwife

## 2016-09-17 DIAGNOSIS — Z30013 Encounter for initial prescription of injectable contraceptive: Secondary | ICD-10-CM

## 2016-09-17 DIAGNOSIS — Z3202 Encounter for pregnancy test, result negative: Secondary | ICD-10-CM

## 2016-09-17 DIAGNOSIS — Z3042 Encounter for surveillance of injectable contraceptive: Secondary | ICD-10-CM

## 2016-09-17 LAB — POCT PREGNANCY, URINE: PREG TEST UR: NEGATIVE

## 2016-09-17 MED ORDER — MEDROXYPROGESTERONE ACETATE 150 MG/ML IM SUSP
150.0000 mg | Freq: Once | INTRAMUSCULAR | Status: AC
  Administered 2016-09-17: 150 mg via INTRAMUSCULAR

## 2016-09-17 NOTE — Progress Notes (Signed)
Subjective:     Maria Bruce is a 26 y.o. female who presents for a postpartum visit. She is 10 weeks postpartum following a spontaneous vaginal delivery. I have fully reviewed the prenatal and intrapartum course. The delivery was at *41** gestational weeks. Outcome: spontaneous vaginal delivery. Anesthesia: epidural. Postpartum course has been uneventful. Baby's course has been uneventful. Baby is feeding by bottle - Similac Advance. Bleeding no bleeding. Bowel function is normal. Bladder function is normal. Patient is sexually active. Contraception method is none. Postpartum depression screening: negative.  The following portions of the patient's history were reviewed and updated as appropriate: allergies, current medications, past family history, past medical history, past social history, past surgical history and problem list.  Review of Systems Pertinent items are noted in HPI.   Objective:    BP 123/73   Pulse 92   Ht  (1.626 m)   Wt 206 lb 12.8 oz (93.8 kg)   LMP 08/24/2016   Breastfeeding? No   BMI 35.50 kg/m   General:  alert, cooperative and no distress   Breasts:  inspection negative, no nipple discharge or bleeding, no masses or nodularity palpable  Lungs: clear to auscultation bilaterally  Heart:  regular rate and rhythm, S1, S2 normal, no murmur, click, rub or gallop  Abdomen: soft, non-tender; bowel sounds normal; no masses,  no organomegaly   Vulva:  not evaluated  Vagina: not evaluated  Cervix:  n/a  Corpus: not examined  Adnexa:  not evaluated  Rectal Exam: Not performed.        Assessment:     Normal postpartum exam. Pap smear not done at today's visit.   Plan:    1. Contraception: Depo-Provera injections 2. Discussed possible irregular bleeding first 3-6 mos of DepoProvera. Has used it before.  Declines Nexplanon 3. Follow up in: 3 months or as needed.

## 2016-09-18 ENCOUNTER — Encounter: Payer: Self-pay | Admitting: Advanced Practice Midwife

## 2016-09-18 NOTE — Patient Instructions (Signed)

## 2016-12-04 ENCOUNTER — Ambulatory Visit: Payer: Medicaid Other

## 2017-06-08 NOTE — L&D Delivery Note (Signed)
Delivery Note Patient had a Induction of labor due to post dates (2611w0d). Pregnancy was complicated by late initiation of prenatal care.   At 5:43 PM a viable female was delivered via Vaginal, Spontaneous (Presentation: vertex ).  APGAR: 9, 9; weight pending .   Placenta status: three vessel Cord:    Anesthesia:  epidural Episiotomy: None Lacerations: None Suture Repair: no suture repair Est. Blood Loss (mL): 20  Mom to postpartum.  Baby to Couplet care / Skin to Skin.  Maria Bruce 12/07/2017, 6:13 PM

## 2017-08-11 ENCOUNTER — Emergency Department (HOSPITAL_COMMUNITY): Admission: EM | Admit: 2017-08-11 | Discharge: 2017-08-11 | Discharge status: Against Medical Advice | Payer: Self-pay

## 2017-08-11 ENCOUNTER — Encounter (HOSPITAL_COMMUNITY): Payer: Self-pay | Admitting: Emergency Medicine

## 2017-08-11 ENCOUNTER — Other Ambulatory Visit: Payer: Self-pay

## 2017-08-11 DIAGNOSIS — R079 Chest pain, unspecified: Secondary | ICD-10-CM | POA: Insufficient documentation

## 2017-08-11 DIAGNOSIS — Z5321 Procedure and treatment not carried out due to patient leaving prior to being seen by health care provider: Secondary | ICD-10-CM | POA: Diagnosis not present

## 2017-08-11 NOTE — ED Notes (Signed)
Pt called multiple times for triage and no response. Pt left pretriage

## 2017-08-11 NOTE — ED Triage Notes (Signed)
Pt state she is been having intermittent cp for the past few days along with cold symptoms.

## 2017-08-11 NOTE — ED Notes (Signed)
Pt oriented about flu restrictions for kids, pt has a 25 year old kid with her that she doesn't have no one to take care of the baby while she is check in, pt verbalize understanding of policy.

## 2017-08-12 ENCOUNTER — Emergency Department (HOSPITAL_COMMUNITY)
Admission: EM | Admit: 2017-08-12 | Discharge: 2017-08-12 | Disposition: A | Discharge status: Home / Self Care | Payer: Medicaid Other | Attending: Emergency Medicine | Admitting: Emergency Medicine

## 2017-08-12 ENCOUNTER — Emergency Department (HOSPITAL_COMMUNITY): Payer: Medicaid Other

## 2017-08-12 LAB — CBC
HCT: 32.8 % — ABNORMAL LOW (ref 36.0–46.0)
Hemoglobin: 10.4 g/dL — ABNORMAL LOW (ref 12.0–15.0)
MCH: 25.4 pg — ABNORMAL LOW (ref 26.0–34.0)
MCHC: 31.7 g/dL (ref 30.0–36.0)
MCV: 80 fL (ref 78.0–100.0)
Platelets: 256 10*3/uL (ref 150–400)
RBC: 4.1 MIL/uL (ref 3.87–5.11)
RDW: 15.3 % (ref 11.5–15.5)
WBC: 13.3 10*3/uL — AB (ref 4.0–10.5)

## 2017-08-12 LAB — BASIC METABOLIC PANEL
ANION GAP: 11 (ref 5–15)
CO2: 20 mmol/L — ABNORMAL LOW (ref 22–32)
Calcium: 8.4 mg/dL — ABNORMAL LOW (ref 8.9–10.3)
Chloride: 104 mmol/L (ref 101–111)
Creatinine, Ser: 0.43 mg/dL — ABNORMAL LOW (ref 0.44–1.00)
Glucose, Bld: 87 mg/dL (ref 65–99)
POTASSIUM: 3.4 mmol/L — AB (ref 3.5–5.1)
SODIUM: 135 mmol/L (ref 135–145)

## 2017-08-12 LAB — I-STAT TROPONIN, ED: Troponin i, poc: 0 ng/mL (ref 0.00–0.08)

## 2017-08-12 LAB — I-STAT CG4 LACTIC ACID, ED: Lactic Acid, Venous: 0.87 mmol/L (ref 0.5–1.9)

## 2017-08-12 LAB — I-STAT BETA HCG BLOOD, ED (MC, WL, AP ONLY)

## 2017-08-12 NOTE — ED Notes (Signed)
Pt observed leaving the ED and has not returned

## 2017-10-25 ENCOUNTER — Ambulatory Visit: Payer: Medicaid Other

## 2017-10-31 ENCOUNTER — Inpatient Hospital Stay (HOSPITAL_COMMUNITY): Payer: Medicaid Other

## 2017-10-31 ENCOUNTER — Inpatient Hospital Stay (HOSPITAL_COMMUNITY)
Admission: AD | Admit: 2017-10-31 | Discharge: 2017-10-31 | Disposition: A | Discharge status: Home / Self Care | Payer: Medicaid Other | Source: Ambulatory Visit | Attending: Obstetrics & Gynecology | Admitting: Obstetrics & Gynecology

## 2017-10-31 ENCOUNTER — Encounter (HOSPITAL_COMMUNITY): Payer: Self-pay | Admitting: *Deleted

## 2017-10-31 DIAGNOSIS — O99513 Diseases of the respiratory system complicating pregnancy, third trimester: Secondary | ICD-10-CM | POA: Insufficient documentation

## 2017-10-31 DIAGNOSIS — Z3A35 35 weeks gestation of pregnancy: Secondary | ICD-10-CM

## 2017-10-31 DIAGNOSIS — O26893 Other specified pregnancy related conditions, third trimester: Secondary | ICD-10-CM

## 2017-10-31 DIAGNOSIS — O98813 Other maternal infectious and parasitic diseases complicating pregnancy, third trimester: Secondary | ICD-10-CM | POA: Diagnosis not present

## 2017-10-31 DIAGNOSIS — O0933 Supervision of pregnancy with insufficient antenatal care, third trimester: Secondary | ICD-10-CM | POA: Diagnosis not present

## 2017-10-31 DIAGNOSIS — B379 Candidiasis, unspecified: Secondary | ICD-10-CM

## 2017-10-31 DIAGNOSIS — J45909 Unspecified asthma, uncomplicated: Secondary | ICD-10-CM | POA: Diagnosis not present

## 2017-10-31 DIAGNOSIS — R109 Unspecified abdominal pain: Secondary | ICD-10-CM

## 2017-10-31 DIAGNOSIS — Z79899 Other long term (current) drug therapy: Secondary | ICD-10-CM | POA: Insufficient documentation

## 2017-10-31 DIAGNOSIS — Z3687 Encounter for antenatal screening for uncertain dates: Secondary | ICD-10-CM

## 2017-10-31 DIAGNOSIS — Z8249 Family history of ischemic heart disease and other diseases of the circulatory system: Secondary | ICD-10-CM | POA: Insufficient documentation

## 2017-10-31 DIAGNOSIS — M549 Dorsalgia, unspecified: Secondary | ICD-10-CM | POA: Diagnosis present

## 2017-10-31 LAB — RAPID URINE DRUG SCREEN, HOSP PERFORMED
Amphetamines: NOT DETECTED
Barbiturates: NOT DETECTED
Benzodiazepines: NOT DETECTED
COCAINE: NOT DETECTED
OPIATES: NOT DETECTED
TETRAHYDROCANNABINOL: NOT DETECTED

## 2017-10-31 LAB — CBC
HCT: 28 % — ABNORMAL LOW (ref 36.0–46.0)
Hemoglobin: 8.3 g/dL — ABNORMAL LOW (ref 12.0–15.0)
MCH: 22.6 pg — ABNORMAL LOW (ref 26.0–34.0)
MCHC: 29.6 g/dL — ABNORMAL LOW (ref 30.0–36.0)
MCV: 76.3 fL — AB (ref 78.0–100.0)
PLATELETS: 263 10*3/uL (ref 150–400)
RBC: 3.67 MIL/uL — AB (ref 3.87–5.11)
RDW: 15.1 % (ref 11.5–15.5)
WBC: 11.6 10*3/uL — AB (ref 4.0–10.5)

## 2017-10-31 LAB — WET PREP, GENITAL
CLUE CELLS WET PREP: NONE SEEN
Sperm: NONE SEEN
TRICH WET PREP: NONE SEEN

## 2017-10-31 LAB — URINALYSIS, ROUTINE W REFLEX MICROSCOPIC
BACTERIA UA: NONE SEEN
Bilirubin Urine: NEGATIVE
GLUCOSE, UA: NEGATIVE mg/dL
HGB URINE DIPSTICK: NEGATIVE
KETONES UR: NEGATIVE mg/dL
NITRITE: NEGATIVE
PH: 5 (ref 5.0–8.0)
PROTEIN: 30 mg/dL — AB
Specific Gravity, Urine: 1.03 (ref 1.005–1.030)

## 2017-10-31 LAB — OB RESULTS CONSOLE GC/CHLAMYDIA: Gonorrhea: NEGATIVE

## 2017-10-31 MED ORDER — FAMOTIDINE 20 MG PO TABS: 20.0000 mg | ORAL_TABLET | Freq: Two times a day (BID) | ORAL | 0 refills | Status: DC

## 2017-10-31 MED ORDER — FERROUS FUMARATE-FOLIC ACID 324-1 MG PO TABS: 1.0000 | ORAL_TABLET | Freq: Two times a day (BID) | ORAL | 0 refills | Status: DC

## 2017-10-31 MED ORDER — FLUCONAZOLE 150 MG PO TABS: 150.0000 mg | ORAL_TABLET | Freq: Once | ORAL | 1 refills | Status: AC

## 2017-10-31 NOTE — MAU Provider Note (Addendum)
History     CSN: [id number]  Arrival date and time: 10/31/17 8295   First Provider Initiated Contact with Patient 10/31/17 1915      Chief Complaint  Patient presents with  . Back Pain  . Abdominal Pain  . Possible Pregnancy   HPI  [name] is a 27 y.o. year old G49P5005 female at [redacted]w[redacted]d weeks gestation by LMP who presents to MAU reporting sharp lower, intermittent back pain (rated 10/10), intermittent burning abdominal pain in the upper mid region (rated 5-6/10), unsure dates, (+) HPT, possible pregnancy. She reports she has had "a lot of family issues that has prevented her from getting [facility] until now".  Past Medical History:  Diagnosis Date  . Asthma    as a child    Past Surgical History:  Procedure Laterality Date  . NO PAST SURGERIES      Family History  Problem Relation Age of Onset  . Heart attack Mother   . Hypertension Paternal Grandmother   . Diabetes Paternal Grandmother     Social History   Tobacco Use  . Smoking status: Never Smoker  . Smokeless tobacco: Never Used  Substance Use Topics  . Alcohol use: No  . Drug use: No    Allergies: No Known Allergies  Medications Prior to Admission  Medication Sig Dispense Refill Last Dose  . iron polysaccharides (NIFEREX) 150 MG capsule Take 1 capsule (150 mg total) by mouth 2 (two) times daily. 60 capsule 3 Past Week at Unknown time  . Prenatal Vit-Fe Fumarate-FA (PRENATAL MULTIVITAMIN) TABS tablet Take 1 tablet by mouth daily at 12 noon.   10/31/2017 at Unknown time  . ibuprofen (ADVIL,MOTRIN) 600 MG tablet Take 1 tablet (600 mg total) by mouth every 6 (six) hours as needed. (Patient not taking: Reported on 09/17/2016) 30 tablet 0 Not Taking    Review of Systems  Constitutional: Negative.   HENT: Negative.   Eyes: Negative.   Respiratory: Negative.   Cardiovascular: Negative.   Gastrointestinal: Negative.   Endocrine: Negative.   Genitourinary: Positive for vaginal pain (burning).   Musculoskeletal: Positive for back pain.  Skin: Negative.   Allergic/Immunologic: Negative.   Neurological: Negative.   Hematological: Negative.   Psychiatric/Behavioral: Negative.    Physical Exam   Blood pressure 120/64, pulse (!) 111, temperature 98.1 F (36.7 C), temperature source Oral, resp. rate 18, weight 234 lb (106.1 kg), last menstrual period 03/17/2017, SpO2 99 %.  Physical Exam  Nursing note and vitals reviewed. Constitutional: She is oriented to person, place, and time. She appears well-developed and well-nourished.  HENT:  Head: Normocephalic and atraumatic.  Eyes: Pupils are equal, round, and reactive to light.  Neck: Normal range of motion.  Cardiovascular: Normal rate, regular rhythm, normal heart sounds and intact distal pulses.  Respiratory: Effort normal and breath sounds normal.  GI: Soft. Bowel sounds are normal.  Genitourinary:  Genitourinary Comments: Uterus: gravid, S=D, SE: cervix is smooth, pink, no lesions, copious amt of thick, chunky, yellowish-white vaginal d/c -- WP, GC/CT done, closed/50%/soft/presenting part undeterminable, no CMT or friability, no adnexal tenderness   Musculoskeletal: Normal range of motion.  Neurological: She is alert and oriented to person, place, and time.  Skin: Skin is warm and dry.  Psychiatric: She has a normal mood and affect. Her behavior is normal. Judgment and thought content normal.    MAU Course  Procedures  MDM [facility] UDS CBC w/Diff ABO/Rh HCG Wet Prep GC/CT -- pending HIV -- pending OB <  14 wks [place] with TV FH: 34 cm NST - FHR: 150 bpm / moderate variability / accels present / decels absent / TOCO: UI noted   Results for orders placed or performed during the hospital encounter of 10/31/17 (from the past 24 hour(s))  Wet prep, genital     Status: Abnormal   Collection Time: 10/31/17  7:18 PM  Result Value Ref Range   Yeast Wet Prep HPF POC PRESENT (A) NONE SEEN   Trich, Wet Prep NONE SEEN NONE SEEN    Clue Cells Wet Prep HPF POC NONE SEEN NONE SEEN   WBC, Wet Prep HPF POC MANY (A) NONE SEEN   Sperm NONE SEEN   CBC     Status: Abnormal   Collection Time: 10/31/17  7:35 PM  Result Value Ref Range   WBC 11.6 (H) 4.0 - 10.5 K/uL   RBC 3.67 (L) 3.87 - 5.11 MIL/uL   Hemoglobin 8.3 (L) 12.0 - 15.0 g/dL   HCT 09.8 (L) 11.9 - 14.7 %   MCV 76.3 (L) 78.0 - 100.0 fL   MCH 22.6 (L) 26.0 - 34.0 pg   MCHC 29.6 (L) 30.0 - 36.0 g/dL   RDW 82.9 56.2 - 13.0 %   Platelets 263 150 - 400 K/uL    No results found.   Report given to and care assumed by: Luna Kitchens, CNM @ [address], CNM 10/31/2017, 7:28 PM   -MFM limited US shows Mason Jim fetus at 35 weeks and 5 days, weight is the 69% -OB labs pending; Hgb 8.5.  Patient is asymptomatic; states that her iron is always low her whole life. She has not had an iron infusion that she can remember.  Assessment and Plan   1. Yeast infection   2. No prenatal care in current pregnancy in third trimester   3. [redacted] weeks gestation of pregnancy   4. Encounter for antenatal screening for uncertain dates   5. Limited prenatal care in third trimester    2. Patient stable for discharge.   3. Msg sent to [facility] and [facility] for urgent appointment this week to begin prenatal care.   4. Message sent to [place] for [place] appt as soon as possible.   5. Patient will needs 1 hour GTT, GBS in the office.   6. RX for Diflucan, pepcid, Iron sent to pharmacy on file.

## 2017-10-31 NOTE — MAU Note (Signed)
Patient c/o  +back pain sHarp lower back Intermittent Rating pain 10/10 States when it happens she can barely moves; gets stiff  +abdominal pain Upper mid Burning Intermittent Rating pain 5-6/10  LMP in December; unsure dates +HPT  Denies vaginal bleeding or discharge  No PNC  States has had a lot of family issues to take care of.

## 2017-10-31 NOTE — Discharge Instructions (Signed)

## 2017-11-01 LAB — HEPATITIS B SURFACE ANTIGEN: HEP B S AG: NEGATIVE

## 2017-11-01 LAB — RPR: RPR: NONREACTIVE

## 2017-11-01 LAB — HIV ANTIBODY (ROUTINE TESTING W REFLEX): HIV SCREEN 4TH GENERATION: NONREACTIVE

## 2017-11-02 LAB — CULTURE, OB URINE

## 2017-11-02 LAB — GC/CHLAMYDIA PROBE AMP (~~LOC~~) NOT AT ARMC
Chlamydia: NEGATIVE
Neisseria Gonorrhea: NEGATIVE

## 2017-11-02 LAB — RUBELLA SCREEN: RUBELLA: 7.28 {index} (ref >0.99)

## 2017-11-04 ENCOUNTER — Ambulatory Visit (INDEPENDENT_AMBULATORY_CARE_PROVIDER_SITE_OTHER): Payer: Medicaid Other | Admitting: Student

## 2017-11-04 ENCOUNTER — Encounter: Payer: Self-pay | Admitting: Student

## 2017-11-04 ENCOUNTER — Encounter: Payer: Self-pay | Admitting: *Deleted

## 2017-11-04 DIAGNOSIS — Z3483 Encounter for supervision of other normal pregnancy, third trimester: Secondary | ICD-10-CM | POA: Diagnosis not present

## 2017-11-04 DIAGNOSIS — Z23 Encounter for immunization: Secondary | ICD-10-CM | POA: Diagnosis not present

## 2017-11-04 DIAGNOSIS — O0933 Supervision of pregnancy with insufficient antenatal care, third trimester: Secondary | ICD-10-CM

## 2017-11-04 DIAGNOSIS — O0930 Supervision of pregnancy with insufficient antenatal care, unspecified trimester: Secondary | ICD-10-CM | POA: Insufficient documentation

## 2017-11-04 DIAGNOSIS — Z3480 Encounter for supervision of other normal pregnancy, unspecified trimester: Secondary | ICD-10-CM | POA: Insufficient documentation

## 2017-11-04 LAB — POCT URINALYSIS DIP (DEVICE)
Bilirubin Urine: NEGATIVE
Glucose, UA: NEGATIVE mg/dL
Hgb urine dipstick: NEGATIVE
KETONES UR: NEGATIVE mg/dL
Nitrite: NEGATIVE
PH: 7.5 (ref 5.0–8.0)
PROTEIN: NEGATIVE mg/dL
SPECIFIC GRAVITY, URINE: 1.015 (ref 1.005–1.030)
Urobilinogen, UA: 1 mg/dL (ref 0.0–1.0)

## 2017-11-04 LAB — OB RESULTS CONSOLE GBS: GBS: NEGATIVE

## 2017-11-04 MED ORDER — TETANUS-DIPHTH-ACELL PERTUSSIS 5-2.5-18.5 LF-MCG/0.5 IM SUSP: 0.5000 mL | Freq: Once | INTRAMUSCULAR | Status: DC

## 2017-11-04 NOTE — Progress Notes (Signed)
Declines flu. Declines MyChart. Declines [employer] app.

## 2017-11-04 NOTE — Progress Notes (Signed)
Subjective:   Maria Bruce is a 27 y.o. [phone] at [redacted]w[redacted]d by 35 wk ultrasound being seen today for her first obstetrical visit.  Her obstetrical history is significant for limited prenatal care. Patient does not intend to breast feed. Pregnancy history fully reviewed. She is in committed relationship with her significant other who is the FOB for all of her children. Reason for late to care is d/t multiple life stressors including FOB being in MVA & being in the hospital for several months, moving to another house, and being out of the state for a family death.  She was seen in [facility] on 5/27 for abdominal pain. Was ~32 wks by LMP but per ultrasound through [facility] was [redacted]w[redacted]d.  Denies any complications with previous pregnancies.   Patient reports no complaints.  HISTORY: OB History  Gravida Para Term Preterm AB Living  0 0 5  SAB TAB Ectopic Multiple Live Births  0 0 0 0 5    # Outcome Date GA Lbr Len/2nd Weight Sex Delivery Anes PTL Lv  6 Current           5 Term 07/16/16 [redacted]w[redacted]d / 00:19 7 lb 3 oz (3.26 kg) F Vag-Spont EPI  LIV     Name: Maria Bruce, Maria Bruce     Apgar1: 9  Apgar5: 9  4 Term 10/24/14 [redacted]w[redacted]d 05:02 / 00:47 8 lb 4.8 oz (3.765 kg) M Vag-Spont EPI  LIV     Birth Comments: none     Apgar1: 9  Apgar5: 9  3 Term 09/29/13 [redacted]w[redacted]d 07:23 / 00:12 7 lb 6.4 oz (3.357 kg) F Vag-Spont EPI  LIV     Name: Maria Bruce, Maria Bruce     Apgar1: 9  Apgar5: 9  2 Term 03/17/12 [redacted]w[redacted]d 05:07 / 00:25 6 lb 5.4 oz (2.875 kg) M Vag-Spont EPI  LIV     Name: Maria Bruce,Maria Bruce     Apgar1: 9  Apgar5: 9  1 Term 2011     Vag-Spont   LIV   Past Medical History:  Diagnosis Date  . Asthma    as a child   Past Surgical History:  Procedure Laterality Date  . NO PAST SURGERIES     Family History  Problem Relation Age of Onset  . Heart attack Mother   . Hypertension Paternal Grandmother   . Diabetes Paternal Grandmother    Social History   Tobacco Use  . Smoking  status: Never Smoker  . Smokeless tobacco: Never Used  Substance Use Topics  . Alcohol use: No  . Drug use: No   No Known Allergies Current Outpatient Medications on File Prior to Visit  Medication Sig Dispense Refill  . famotidine (PEPCID) 20 MG tablet Take 1 tablet (20 mg total) by mouth 2 (two) times daily. 60 tablet 0  . Ferrous Fumarate-Folic Acid 324-1 MG TABS Take 1 tablet by mouth 2 (two) times daily. 60 each 0  . iron polysaccharides (NIFEREX) 150 MG capsule Take 1 capsule (150 mg total) by mouth 2 (two) times daily. 60 capsule 3  . Prenatal Vit-Fe Fumarate-FA (PRENATAL MULTIVITAMIN) TABS tablet Take 1 tablet by mouth daily at 12 noon.     No current facility-administered medications on file prior to visit.    Exam   Vitals:   11/04/17 1455  BP: 99/64  Pulse: 98  Weight: 230 lb 8 oz (104.6 kg)   Fetal Heart Rate (bpm): 135  Uterus:  Fundal Height: 36 cm  Vulva: normal external genitalia, no lesions  System: General: well-developed, well-nourished female in no acute distress   Skin: normal coloration and turgor, no rashes   Neurologic: oriented, normal, negative, normal mood   Extremities: normal strength, tone, and muscle mass, ROM of all joints is normal   HEENT sclera clear, anicteric   Mouth/Teeth mucous membranes moist, pharynx normal without lesions and dental hygiene good   Neck supple and no masses   Cardiovascular: regular rate and rhythm   Respiratory:  no respiratory distress, normal breath sounds   Abdomen: soft, non-tender; bowel sounds normal; no masses,  no organomegaly     Assessment:   Pregnancy: [id number] Patient Active Problem List   Diagnosis Date Noted  . Supervision of other normal pregnancy, antepartum 11/04/2017  . Late prenatal care affecting pregnancy 11/04/2017     Plan:  1. Supervision of other normal pregnancy, antepartum  - Glucose tolerance, 1 hour - SMN1 COPY NUMBER ANALYSIS (SMA Carrier Screen) - Culture, beta strep (group  b only)  2. Late prenatal care affecting pregnancy, antepartum  - Glucose tolerance, 1 hour - SMN1 COPY NUMBER ANALYSIS (SMA Carrier Screen)   Initial labs drawn. 1 hour GTT Continue prenatal vitamins. Genetic Screening discussed, late to care Ultrasound discussed; fetal anatomic survey: ordered. Problem list reviewed and updated. The nature of [facility] with multiple MDs and other Advanced Practice Providers was explained to patient; also emphasized that residents, students are part of our team. Routine obstetric precautions reviewed. Return in about 1 week (around 11/11/2017) for Routine OB.   Judeth Horn 3:59 PM 11/04/17

## 2017-11-04 NOTE — Patient Instructions (Addendum)
 Contraception Choices Contraception, also called birth control, refers to methods or devices that prevent pregnancy. Hormonal methods Contraceptive implant A contraceptive implant is a thin, plastic tube that contains a hormone. It is inserted into the upper part of the arm. It can remain in place for up to 3 years. Progestin-only injections Progestin-only injections are injections of progestin, a synthetic form of the hormone progesterone. They are given every 3 months by a health care provider. Birth control pills Birth control pills are pills that contain hormones that prevent pregnancy. They must be taken once a day, preferably at the same time each day. Birth control patch The birth control patch contains hormones that prevent pregnancy. It is placed on the skin and must be changed once a week for three weeks and removed on the fourth week. A prescription is needed to use this method of contraception. Vaginal ring A vaginal ring contains hormones that prevent pregnancy. It is placed in the vagina for three weeks and removed on the fourth week. After that, the process is repeated with a new ring. A prescription is needed to use this method of contraception. Emergency contraceptive Emergency contraceptives prevent pregnancy after unprotected sex. They come in pill form and can be taken up to 5 days after sex. They work best the sooner they are taken after having sex. Most emergency contraceptives are available without a prescription. This method should not be used as your only form of birth control. Barrier methods Female condom A female condom is a thin sheath that is worn over the penis during sex. Condoms keep sperm from going inside a woman's body. They can be used with a spermicide to increase their effectiveness. They should be disposed after a single use. Female condom A female condom is a soft, loose-fitting sheath that is put into the vagina before sex. The condom keeps sperm from  going inside a woman's body. They should be disposed after a single use. Diaphragm A diaphragm is a soft, dome-shaped barrier. It is inserted into the vagina before sex, along with a spermicide. The diaphragm blocks sperm from entering the uterus, and the spermicide kills sperm. A diaphragm should be left in the vagina for 6-8 hours after sex and removed within 24 hours. A diaphragm is prescribed and fitted by a health care provider. A diaphragm should be replaced every 1-2 years, after giving birth, after gaining more than 15 lb (6.8 kg), and after pelvic surgery. Cervical cap A cervical cap is a round, soft latex or plastic cup that fits over the cervix. It is inserted into the vagina before sex, along with spermicide. It blocks sperm from entering the uterus. The cap should be left in place for 6-8 hours after sex and removed within 48 hours. A cervical cap must be prescribed and fitted by a health care provider. It should be replaced every 2 years. Sponge A sponge is a soft, circular piece of polyurethane foam with spermicide on it. The sponge helps block sperm from entering the uterus, and the spermicide kills sperm. To use it, you make it wet and then insert it into the vagina. It should be inserted before sex, left in for at least 6 hours after sex, and removed and thrown away within 30 hours. Spermicides Spermicides are chemicals that kill or block sperm from entering the cervix and uterus. They can come as a cream, jelly, suppository, foam, or tablet. A spermicide should be inserted into the vagina with an applicator at least 10-15 minutes   sex to allow time for it to work. The process must be repeated every time you have sex. Spermicides do not require a prescription. Intrauterine contraception Intrauterine device (IUD) An IUD is a T-shaped device that is put in a woman's uterus. There are two types:  Hormone IUD.This type contains progestin, a synthetic form of the hormone progesterone. This  type can stay in place for 3-5 years.  Copper IUD.This type is wrapped in copper wire. It can stay in place for 10 years.  Permanent methods of contraception Female tubal ligation In this method, a woman's fallopian tubes are sealed, tied, or blocked during surgery to prevent eggs from traveling to the uterus. Hysteroscopic sterilization In this method, a small, flexible insert is placed into each fallopian tube. The inserts cause scar tissue to form in the fallopian tubes and block them, so sperm cannot reach an egg. The procedure takes about 3 months to be effective. Another form of birth control must be used during those 3 months. Female sterilization This is a procedure to tie off the tubes that carry sperm (vasectomy). After the procedure, the man can still ejaculate fluid (semen). Natural planning methods Natural family planning In this method, a couple does not have sex on days when the woman could become pregnant. Calendar method This means keeping track of the length of each menstrual cycle, identifying the days when pregnancy can happen, and not having sex on those days. Ovulation method In this method, a couple avoids sex during ovulation. Symptothermal method This method involves not having sex during ovulation. The woman typically checks for ovulation by watching changes in her temperature and in the consistency of cervical mucus. Post-ovulation method In this method, a couple waits to have sex until after ovulation. Summary  Contraception, also called birth control, means methods or devices that prevent pregnancy.  Hormonal methods of contraception include implants, injections, pills, patches, vaginal rings, and emergency contraceptives.  Barrier methods of contraception can include female condoms, female condoms, diaphragms, cervical caps, sponges, and spermicides.  There are two types of IUDs (intrauterine devices). An IUD can be put in a woman's uterus to prevent pregnancy  for 3-5 years.  Permanent sterilization can be done through a procedure for males, females, or both.  Natural family planning methods involve not having sex on days when the woman could become pregnant. This information is not intended to replace advice given to you by your health care provider. Make sure you discuss any questions you have with your health care provider. Document Released: 05/25/2005 Document Revised: 06/27/2016 Document Reviewed: 06/27/2016 Elsevier Interactive Patient Education  2018 [employer].     Iron-Rich Diet Iron is a mineral that helps your body to produce hemoglobin. Hemoglobin is a protein in your red blood cells that carries oxygen to your body's tissues. Eating too little iron may cause you to feel weak and tired, and it can increase your risk for infection. Eating enough iron is necessary for your body's metabolism, muscle function, and nervous system. Iron is naturally found in many foods. It can also be added to foods or fortified in foods. There are two types of dietary iron:  Heme iron. Heme iron is absorbed by the body more easily than nonheme iron. Heme iron is found in meat, poultry, and fish.  Nonheme iron. Nonheme iron is found in dietary supplements, iron-fortified grains, beans, and vegetables.  You may need to follow an iron-rich diet if:  You have been diagnosed with iron deficiency or iron-deficiency  anemia.  You have a condition that prevents you from absorbing dietary iron, such as: ? Infection in your intestines. ? Celiac disease. This involves long-lasting (chronic) inflammation of your intestines.  You do not eat enough iron.  You eat a diet that is high in foods that impair iron absorption.  You have lost a lot of blood.  You have heavy bleeding during your menstrual cycle.  You are pregnant.  What is my plan? Your health care provider may help you to determine how much iron you need per day based on your condition.  Generally, when a person consumes sufficient amounts of iron in the diet, the following iron needs are met:  Men. ? 23-19 years old: 11 mg per day. ? 62-27 years old: 8 mg per day.  Women. ? 32-32 years old: 15 mg per day. ? 63-81 years old: 18 mg per day. ? Over 9 years old: 8 mg per day. ? Pregnant women: 27 mg per day. ? Breastfeeding women: 9 mg per day.  What do I need to know about an iron-rich diet?  Eat fresh fruits and vegetables that are high in vitamin C along with foods that are high in iron. This will help increase the amount of iron that your body absorbs from food, especially with foods containing nonheme iron. Foods that are high in vitamin C include oranges, peppers, tomatoes, and mango.  Take iron supplements only as directed by your health care provider. Overdose of iron can be life-threatening. If you were prescribed iron supplements, take them with orange juice or a vitamin C supplement.  Cook foods in pots and pans that are made from iron.  Eat nonheme iron-containing foods alongside foods that are high in heme iron. This helps to improve your iron absorption.  Certain foods and drinks contain compounds that impair iron absorption. Avoid eating these foods in the same meal as iron-rich foods or with iron supplements. These include: ? Coffee, black tea, and red wine. ? Milk, dairy products, and foods that are high in calcium. ? Beans, soybeans, and peas. ? Whole grains.  When eating foods that contain both nonheme iron and compounds that impair iron absorption, follow these tips to absorb iron better. ? Soak beans overnight before cooking. ? Soak whole grains overnight and drain them before using. ? Ferment flours before baking, such as using yeast in bread dough. What foods can I eat? Grains Iron-fortified breakfast cereal. Iron-fortified whole-wheat bread. Enriched rice. Sprouted grains. Vegetables Spinach. Potatoes with skin. Green peas. Broccoli. Red and  green bell peppers. Fermented vegetables. Fruits Prunes. Raisins. Oranges. Strawberries. Mango. Grapefruit. Meats and Other Protein Sources Beef liver. Oysters. Beef. Shrimp. [place]. Chicken. [place]. Sardines. Chickpeas. Nuts. Tofu. Beverages Tomato juice. Fresh orange juice. Prune juice. Hibiscus tea. Fortified instant breakfast shakes. Condiments Tahini. Fermented soy sauce. Sweets and Desserts Black-strap molasses. Other Wheat germ. The items listed above may not be a complete list of recommended foods or beverages. Contact your dietitian for more options. What foods are not recommended? Grains Whole grains. Bran cereal. Bran flour. Oats. Vegetables Artichokes. Brussels sprouts. Kale. Fruits Blueberries. Raspberries. Strawberries. Figs. Meats and Other Protein Sources Soybeans. Products made from soy protein. Dairy Milk. Cream. Cheese. Yogurt. Cottage cheese. Beverages Coffee. Black tea. Red wine. Sweets and Desserts Cocoa. Chocolate. Ice cream. Other Basil. Oregano. Parsley. The items listed above may not be a complete list of foods and beverages to avoid. Contact your dietitian for more information. This information is not intended to  replace advice given to you by your health care provider. Make sure you discuss any questions you have with your health care provider. Document Released: 01/06/2005 Document Revised: 12/13/2015 Document Reviewed: 12/20/2013 Elsevier Interactive Patient Education  [employer].

## 2017-11-04 NOTE — Progress Notes (Signed)
Addendum: PMH form completed. 

## 2017-11-05 ENCOUNTER — Encounter (HOSPITAL_COMMUNITY): Payer: Self-pay

## 2017-11-05 ENCOUNTER — Other Ambulatory Visit (HOSPITAL_COMMUNITY): Payer: Self-pay | Admitting: *Deleted

## 2017-11-05 ENCOUNTER — Ambulatory Visit (HOSPITAL_COMMUNITY)
Admission: RE | Admit: 2017-11-05 | Discharge: 2017-11-05 | Disposition: A | Discharge status: Home / Self Care | Payer: Medicaid Other | Source: Ambulatory Visit | Attending: Student | Admitting: Student

## 2017-11-05 DIAGNOSIS — O0933 Supervision of pregnancy with insufficient antenatal care, third trimester: Secondary | ICD-10-CM | POA: Insufficient documentation

## 2017-11-05 DIAGNOSIS — O99213 Obesity complicating pregnancy, third trimester: Secondary | ICD-10-CM | POA: Insufficient documentation

## 2017-11-05 DIAGNOSIS — Z3A36 36 weeks gestation of pregnancy: Secondary | ICD-10-CM | POA: Diagnosis not present

## 2017-11-05 DIAGNOSIS — Z363 Encounter for antenatal screening for malformations: Secondary | ICD-10-CM

## 2017-11-05 DIAGNOSIS — Z3689 Encounter for other specified antenatal screening: Secondary | ICD-10-CM | POA: Insufficient documentation

## 2017-11-08 LAB — CULTURE, BETA STREP (GROUP B ONLY): Strep Gp B Culture: NEGATIVE

## 2017-11-10 ENCOUNTER — Encounter: Payer: Self-pay | Admitting: Family Medicine

## 2017-11-10 ENCOUNTER — Ambulatory Visit (INDEPENDENT_AMBULATORY_CARE_PROVIDER_SITE_OTHER): Payer: Medicaid Other | Admitting: Nurse Practitioner

## 2017-11-10 VITALS — BP 104/49 | HR 94 | Wt 233.0 lb

## 2017-11-10 DIAGNOSIS — O99013 Anemia complicating pregnancy, third trimester: Secondary | ICD-10-CM | POA: Insufficient documentation

## 2017-11-10 DIAGNOSIS — Z3480 Encounter for supervision of other normal pregnancy, unspecified trimester: Secondary | ICD-10-CM

## 2017-11-10 LAB — SMN1 COPY NUMBER ANALYSIS (SMA CARRIER SCREENING)

## 2017-11-10 LAB — GLUCOSE TOLERANCE, 1 HOUR: Glucose, 1Hr PP: 116 mg/dL (ref 65–199)

## 2017-11-10 NOTE — Patient Instructions (Addendum)
Get stool softener to use with iron supplement.   Iron-Rich Diet Iron is a mineral that helps your body to produce hemoglobin. Hemoglobin is a protein in your red blood cells that carries oxygen to your body's tissues. Eating too little iron may cause you to feel weak and tired, and it can increase your risk for infection. Eating enough iron is necessary for your body's metabolism, muscle function, and nervous system. Iron is naturally found in many foods. It can also be added to foods or fortified in foods. There are two types of dietary iron:  Heme iron. Heme iron is absorbed by the body more easily than nonheme iron. Heme iron is found in meat, poultry, and fish.  Nonheme iron. Nonheme iron is found in dietary supplements, iron-fortified grains, beans, and vegetables.  You may need to follow an iron-rich diet if:  You have been diagnosed with iron deficiency or iron-deficiency anemia.  You have a condition that prevents you from absorbing dietary iron, such as: ? Infection in your intestines. ? Celiac disease. This involves long-lasting (chronic) inflammation of your intestines.  You do not eat enough iron.  You eat a diet that is high in foods that impair iron absorption.  You have lost a lot of blood.  You have heavy bleeding during your menstrual cycle.  You are pregnant.  What is my plan? Your health care provider may help you to determine how much iron you need per day based on your condition. Generally, when a person consumes sufficient amounts of iron in the diet, the following iron needs are met:  Men. ? 24-32 years old: 11 mg per day. ? 44-69 years old: 8 mg per day.  Women. ? 80-39 years old: 15 mg per day. ? 42-54 years old: 18 mg per day. ? Over 66 years old: 8 mg per day. ? Pregnant women: 27 mg per day. ? Breastfeeding women: 9 mg per day.  What do I need to know about an iron-rich diet?  Eat fresh fruits and vegetables that are high in vitamin C along  with foods that are high in iron. This will help increase the amount of iron that your body absorbs from food, especially with foods containing nonheme iron. Foods that are high in vitamin C include oranges, peppers, tomatoes, and mango.  Take iron supplements only as directed by your health care provider. Overdose of iron can be life-threatening. If you were prescribed iron supplements, take them with orange juice or a vitamin C supplement.  Cook foods in pots and pans that are made from iron.  Eat nonheme iron-containing foods alongside foods that are high in heme iron. This helps to improve your iron absorption.  Certain foods and drinks contain compounds that impair iron absorption. Avoid eating these foods in the same meal as iron-rich foods or with iron supplements. These include: ? Coffee, black tea, and red wine. ? Milk, dairy products, and foods that are high in calcium. ? Beans, soybeans, and peas. ? Whole grains.  When eating foods that contain both nonheme iron and compounds that impair iron absorption, follow these tips to absorb iron better. ? Soak beans overnight before cooking. ? Soak whole grains overnight and drain them before using. ? Ferment flours before baking, such as using yeast in bread dough. What foods can I eat? Grains Iron-fortified breakfast cereal. Iron-fortified whole-wheat bread. Enriched rice. Sprouted grains. Vegetables Spinach. Potatoes with skin. Green peas. Broccoli. Red and green bell peppers. Fermented vegetables. Fruits Prunes.  Raisins. Oranges. Strawberries. Mango. Grapefruit. Meats and Other Protein Sources Beef liver. Oysters. Beef. Shrimp. [place]. Chicken. [place]. Sardines. Chickpeas. Nuts. Tofu. Beverages Tomato juice. Fresh orange juice. Prune juice. Hibiscus tea. Fortified instant breakfast shakes. Condiments Tahini. Fermented soy sauce. Sweets and Desserts Black-strap molasses. Other Wheat germ. The items listed above may not be a  complete list of recommended foods or beverages. Contact your dietitian for more options. What foods are not recommended? Grains Whole grains. Bran cereal. Bran flour. Oats. Vegetables Artichokes. Brussels sprouts. Kale. Fruits Blueberries. Raspberries. Strawberries. Figs. Meats and Other Protein Sources Soybeans. Products made from soy protein. Dairy Milk. Cream. Cheese. Yogurt. Cottage cheese. Beverages Coffee. Black tea. Red wine. Sweets and Desserts Cocoa. Chocolate. Ice cream. Other Basil. Oregano. Parsley. The items listed above may not be a complete list of foods and beverages to avoid. Contact your dietitian for more information. This information is not intended to replace advice given to you by your health care provider. Make sure you discuss any questions you have with your health care provider. Document Released: 01/06/2005 Document Revised: 12/13/2015 Document Reviewed: 12/20/2013 Elsevier Interactive Patient Education  2018 [employer].  [employer] of the uterus can occur throughout pregnancy, but they are not always a sign that you are in labor. You may have practice contractions called Braxton Hicks contractions. These false labor contractions are sometimes confused with true labor. What are [place] contractions? Braxton Hicks contractions are tightening movements that occur in the muscles of the uterus before labor. Unlike true labor contractions, these contractions do not result in opening (dilation) and thinning of the cervix. Toward the end of pregnancy (32-34 weeks), Braxton Hicks contractions can happen more often and may become stronger. These contractions are sometimes difficult to tell apart from true labor because they can be very uncomfortable. You should not feel embarrassed if you go to the hospital with false labor. Sometimes, the only way to tell if you are in true labor is for your health care provider to look for changes  in the cervix. The health care provider will do a physical exam and may monitor your contractions. If you are not in true labor, the exam should show that your cervix is not dilating and your water has not broken. If there are other health problems associated with your pregnancy, it is completely safe for you to be sent home with false labor. You may continue to have Braxton Hicks contractions until you go into true labor. How to tell the difference between true labor and false labor True labor  Contractions last 30-70 seconds.  Contractions become very regular.  Discomfort is usually felt in the top of the uterus, and it spreads to the lower abdomen and low back.  Contractions do not go away with walking.  Contractions usually become more intense and increase in frequency.  The cervix dilates and gets thinner. False labor  Contractions are usually shorter and not as strong as true labor contractions.  Contractions are usually irregular.  Contractions are often felt in the front of the lower abdomen and in the groin.  Contractions may go away when you walk around or change positions while lying down.  Contractions get weaker and are shorter-lasting as time goes on.  The cervix usually does not dilate or become thin. Follow these instructions at home:  Take over-the-counter and prescription medicines only as told by your health care provider.  Keep up with your usual exercises and follow other instructions from your  health care provider.  Eat and drink lightly if you think you are going into labor.  If Braxton Hicks contractions are making you uncomfortable: ? Change your position from lying down or resting to walking, or change from walking to resting. ? Sit and rest in a tub of warm water. ? Drink enough fluid to keep your urine pale yellow. Dehydration may cause these contractions. ? Do slow and deep breathing several times an hour.  Keep all follow-up prenatal visits as  told by your health care provider. This is important. Contact a health care provider if:  You have a fever.  You have continuous pain in your abdomen. Get help right away if:  Your contractions become stronger, more regular, and closer together.  You have fluid leaking or gushing from your vagina.  You pass blood-tinged mucus (bloody show).  You have bleeding from your vagina.  You have low back pain that you never had before.  You feel your baby's head pushing down and causing pelvic pressure.  Your baby is not moving inside you as much as it used to. Summary  Contractions that occur before labor are called Braxton Hicks contractions, false labor, or practice contractions.  Braxton Hicks contractions are usually shorter, weaker, farther apart, and less regular than true labor contractions. True labor contractions usually become progressively stronger and regular and they become more frequent.  Manage discomfort from [facility] contractions by changing position, resting in a warm bath, drinking plenty of water, or practicing deep breathing. This information is not intended to replace advice given to you by your health care provider. Make sure you discuss any questions you have with your health care provider. Document Released: 10/08/2016 Document Revised: 10/08/2016 Document Reviewed: 10/08/2016 Elsevier Interactive Patient Education  2018 [employer].

## 2017-11-10 NOTE — Progress Notes (Signed)
    Subjective:  Maria Bruce is a 27 y.o. [id number] at 935w1d being seen today for ongoing prenatal care.  She is currently monitored for the following issues for this low-risk pregnancy and has Supervision of other normal pregnancy, antepartum; Late prenatal care affecting pregnancy; No prenatal care in current pregnancy in third trimester; Encounter for fetal anatomic survey; [redacted] weeks gestation of pregnancy; Obesity affecting pregnancy in third trimester; and Anemia in pregnancy, third trimester on their problem list.  Patient reports no complaints.  Contractions: Irregular. Vag. Bleeding: None.  Movement: Present. Denies leaking of fluid.   The following portions of the patient's history were reviewed and updated as appropriate: allergies, current medications, past family history, past medical history, past social history, past surgical history and problem list. Problem list updated.  Objective:   Vitals:   11/10/17 0841  BP: (!) 104/49  Pulse: 94  Weight: 233 lb (105.7 kg)    Fetal Status: Fetal Heart Rate (bpm): 136 Fundal Height: 38 cm Movement: Present     General:  Alert, oriented and cooperative. Patient is in no acute distress.  Skin: Skin is warm and dry. No rash noted.   Cardiovascular: Normal heart rate noted  Respiratory: Normal respiratory effort, no problems with respiration noted  Abdomen: Soft, gravid, appropriate for gestational age. Pain/Pressure: Present     Pelvic:  Cervical exam deferred        Extremities: Normal range of motion.  Edema: Trace  Mental Status: Normal mood and affect. Normal behavior. Normal judgment and thought content.   Urinalysis:      Assessment and Plan:  Pregnancy: G6P5005 at 785w1d  1. Supervision of other normal pregnancy, antepartum  Anemia noted - 8.3 and client has been on iron supplementation for one week.  Will recheck CBC at next visit. Also discussed the risk of diabetes if her weight continues to be elevated.   Advised working slowly postpartum to decrease weight to 145 pounds or below for her very best health.  Client in agreement - discussed healthy options.  Term labor symptoms and general obstetric precautions including but not limited to vaginal bleeding, contractions, leaking of fluid and fetal movement were reviewed in detail with the patient. Please refer to After Visit Summary for other counseling recommendations.  Return in about 1 week (around 11/17/2017).  Nolene BernheimERRI BURLESON, RN, MSN, NP-BC Nurse Practitioner, [facility], [facility] 11/10/2017 10:29 AM

## 2017-11-11 ENCOUNTER — Encounter: Payer: Self-pay | Admitting: *Deleted

## 2017-11-17 ENCOUNTER — Ambulatory Visit (INDEPENDENT_AMBULATORY_CARE_PROVIDER_SITE_OTHER): Payer: Medicaid Other | Admitting: Nurse Practitioner

## 2017-11-17 ENCOUNTER — Encounter: Payer: Self-pay | Admitting: Obstetrics and Gynecology

## 2017-11-17 VITALS — BP 116/73 | HR 91 | Wt 231.0 lb

## 2017-11-17 DIAGNOSIS — Z3009 Encounter for other general counseling and advice on contraception: Secondary | ICD-10-CM | POA: Insufficient documentation

## 2017-11-17 DIAGNOSIS — Z3483 Encounter for supervision of other normal pregnancy, third trimester: Secondary | ICD-10-CM

## 2017-11-17 DIAGNOSIS — O99013 Anemia complicating pregnancy, third trimester: Secondary | ICD-10-CM

## 2017-11-17 DIAGNOSIS — O99213 Obesity complicating pregnancy, third trimester: Secondary | ICD-10-CM

## 2017-11-17 DIAGNOSIS — Z3480 Encounter for supervision of other normal pregnancy, unspecified trimester: Secondary | ICD-10-CM

## 2017-11-17 DIAGNOSIS — O0933 Supervision of pregnancy with insufficient antenatal care, third trimester: Secondary | ICD-10-CM

## 2017-11-17 NOTE — Patient Instructions (Signed)
Breastfeeding Challenges and Solutions  Even though breastfeeding is natural, it can be challenging, especially in the first few weeks after childbirth. It is normal for problems to arise when starting to breastfeed your new baby, even if you have breastfed before. This document provides some solutions to the most common breastfeeding challenges.  Challenges and solutions  Challenge--Cracked or Sore Nipples  Cracked or sore nipples are commonly experienced by breastfeeding mothers. Cracked or sore nipples often are caused by inadequate latching (when your baby's mouth attaches to your breast to breastfeed). Soreness can also happen if your baby is not positioned properly at your breast. Although nipple cracking and soreness are common during the first week after birth, nipple pain is never normal. If you experience nipple cracking or soreness that lasts longer than 1 week or nipple pain, call your health care provider or lactation consultant.  Solution  Ensure proper latching and positioning of your baby by following the steps below:  · Find a comfortable place to sit or lie down, with your neck and back well supported.  · Place a pillow or rolled up blanket under your baby to bring him or her to the level of your breast (if you are seated).  · Make sure that your baby's abdomen is facing your abdomen.  · Gently massage your breast. With your fingertips, massage from your chest wall toward your nipple in a circular motion. This encourages milk flow. You may need to continue this action during the feeding if your milk flows slowly.  · Support your breast with 4 fingers underneath and your thumb above your nipple. Make sure your fingers are well away from your nipple and your baby’s mouth.  · Stroke your baby's lips gently with your finger or nipple.  · When your baby's mouth is open wide enough, quickly bring your baby to your breast, placing your entire nipple and as much of the colored area around your nipple  (areola) as possible into your baby's mouth.  ? More areola should be visible above your baby's upper lip than below the lower lip.  ? Your baby's tongue should be between his or her lower gum and your breast.  · Ensure that your baby's mouth is correctly positioned around your nipple (latched). Your baby's lips should create a seal on your breast and be turned out (everted).  · It is common for your baby to suck for about 2-3 minutes in order to start the flow of breast milk.    Signs that your baby has successfully latched on to your nipple include:  · Quietly tugging or quietly sucking without causing you pain.  · Swallowing heard between every 3-4 sucks.  · Muscle movement above and in front of his or her ears with sucking.    Signs that your baby has not successfully latched on to nipple include:  · Sucking sounds or smacking sounds from your baby while nursing.  · Nipple pain.    Ensure that your breasts stay moisturized and healthy by:  · Avoiding the use of soap on your nipples.  · Wearing a supportive bra. Avoid wearing underwire-style bras or tight bras.  · Air drying your nipples for 3-4 minutes after each feeding.  · Using only cotton bra pads to absorb breast milk leakage. Leaking of breast milk between feedings is normal. Be sure to change the pads if they become soaked with milk.  · Using lanolin on your nipples after nursing. Lanolin helps to maintain your   skin's normal moisture barrier. If you use pure lanolin you do not need to wash it off before feeding your baby again. Pure lanolin is not toxic to your baby. You may also hand express a few drops of breast milk and gently massage that milk into your nipples, allowing it to air dry.    Challenge--Breast Engorgement  Breast engorgement is the overfilling of your breasts with breast milk. In the first few weeks after giving birth, you may experience breast engorgement. Breast engorgement can make your breasts throb and feel hard, tightly stretched,  warm, and tender. Engorgement peaks about the fifth day after you give birth. Having breast engorgement does not mean you have to stop breastfeeding your baby.  Solution  · Breastfeed when you feel the need to reduce the fullness of your breasts or when your baby shows signs of hunger. This is called "breastfeeding on demand."  · Newborns (babies younger than 4 weeks) often breastfeed every 1-3 hours during the day. You may need to awaken your baby to feed if he or she is asleep at a feeding time.  · Do not allow your baby to sleep longer than 5 hours during the night without a feeding.  · Pump or hand express breast milk before breastfeeding to soften your breast, areola, and nipple.  · Apply warm, moist heat (in the shower or with warm water-soaked hand towels) just before feeding or pumping, or massage your breast before or during breastfeeding. This increases circulation and helps your milk to flow.  · Completely empty your breasts when breastfeeding or pumping. Afterward, wear a snug bra (nursing or regular) or tank top for 1-2 days to signal your body to slightly decrease milk production. Only wear snug bras or tank tops to treat engorgement. Tight bras typically should be avoided by breastfeeding mothers. Once engorgement is relieved, return to wearing regular, loose-fitting clothes.  · Apply ice packs to your breasts to lessen the pain from engorgement and relieve swelling, unless the ice is uncomfortable for you.  · Do not delay feedings. Try to relax when it is time to feed your baby. This helps to trigger your "let-down reflex," which releases milk from your breast.  · Ensure your baby is latched on to your breast and positioned properly while breastfeeding.  · Allow your baby to remain at your breast as long as he or she is latched on well and actively sucking. Your baby will let you know when he or she is done breastfeeding by pulling away from your breast or falling asleep.  · Avoid introducing bottles  or pacifiers to your baby in the early weeks of breastfeeding. Wait to introduce these things until after resolving any breastfeeding challenges.  · Try to pump your milk on the same schedule as when your baby would breastfeed if you are returning to work or away from home for an extended period.  · Drink plenty of fluids to avoid dehydration, which can eventually put you at greater risk of breast engorgement.    If you follow these suggestions, your engorgement should improve in 24-48 hours. If you are still experiencing difficulty, call your lactation consultant or health care provider.  Challenge--Plugged Milk Ducts  Plugged milk ducts occur when the duct does not drain milk effectively and becomes swollen. Wearing a tight-fitting nursing bra or having difficulty with latching may cause plugged milk ducts. Not drinking enough water (8-10 c [1.9-2.4 L] per day) can contribute to plugged milk ducts. Once a   duct has become plugged, hard lumps, soreness, and redness may develop in your breast.  Solution  Do not delay feedings. Feed your baby frequently and try to empty your breasts of milk at each feeding. Try breastfeeding from the affected side first so there is a better chance that the milk will drain completely from that breast. Apply warm, moist towels to your breasts for 5-10 minutes before feeding. Alternatively, a hot shower right before breastfeeding can provide the moist heat that can encourage milk flow. Gentle massage of the sore area before and during a feeding may also help. Avoid wearing tight clothing or bras that put pressure on your breasts. Wear bras that offer good support to your breasts, but avoid underwire bras. If you have a plugged milk duct and develop a fever, you need to see your health care provider.  Challenge--Mastitis  Mastitis is inflammation of your breast. It usually is caused by a bacterial infection and can cause flu-like symptoms. You may develop redness in your breast and a  fever. Often when mastitis occurs, your breast becomes firm, warm, and very painful. The most common causes of mastitis are poor latching, ineffective sucking from your baby, consistent pressure on your breast (possibly from wearing a tight-fitting bra or shirt that restricts the milk flow), unusual stress or fatigue, or missed feedings.  Solution  You will be given antibiotic medicine to treat the infection. It is still important to breastfeed frequently to empty your breasts. Continuing to breastfeed while you recover from mastitis will not harm your baby. Make sure your baby is positioned properly during every feeding. Apply moist heat to your breasts for a few minutes before feeding to help the milk flow and to help your breasts empty more easily.  Challenge--Thrush  Thrush is a yeast infection that can form on your nipples, in your breast, or in your baby's mouth. It causes itching, soreness, burning or stabbing pain, and sometimes a rash.  Solution  You will be given a medicated ointment for your nipples, and your baby will be given a liquid medicine for his or her mouth. It is important that you and your baby are treated at the same time because thrush can be passed between you and your baby. Change disposable nursing pads often. Any bras, towels, or clothing that come in contact with infected areas of your body or your baby's body need to be washed in very hot water every day. Wash your hands and your baby's hands often. All pacifiers, bottle nipples, or toys your baby puts in his or her mouth should be boiled once a day for 20 minutes. After 1 week of treatment, discard pacifiers and bottle nipples and buy new ones. All breast pump parts that touch the milk need to be boiled for 20 minutes every day.  Challenge--Low Milk Supply  You may not be producing enough milk if your baby is not gaining the proper amount of weight. Breast milk production is based on a supply-and-demand system. Your milk supply depends  on how frequently and effectively your baby empties your breast.  Solution  The more you breastfeed and pump, the more breast milk you will produce. It is important that your baby empties at least one of your breasts at each feeding. If this is not happening, then use a breast pump or hand express any milk that remains. This will help to drain as much milk as possible at each feeding. It will also signal your body to produce more   milk. If your baby is not emptying your breasts, it may be due to latching, sucking, or positioning problems. If low milk supply continues after addressing these issues, contact your health care provider or a lactation specialist as soon as possible.  Challenge--Inverted or Flat Nipples  Some women have nipples that turn inward instead of protruding outward. Other women have nipples that are flat. Inverted or flat nipples can sometimes make it more difficult for your baby to latch onto your breast.  Solution  You may be given a small device that pulls out inverted nipples. This device should be applied right before your baby is brought to your breast. You can also try using a breast pump for a short time before placing the baby at your breast. The pump can pull your nipple outwards to help your infant latch more easily. The baby's sucking motion will help the inverted nipple protrude as well.  If you have flat nipples, encourage your baby to latch onto your breast and feed frequently in the early days after birth. This will give your baby practice latching on correctly while your breast is still soft. When your milk supply increases, between the second and fifth day after birth and your breasts become full, your baby will have an easier time latching.  Contact a lactation consultant if you still have concerns. She or he can teach you additional techniques to address breastfeeding problems related to nipple shape and position.  Where to find more information:  [employer]:  www.llli.org  This information is not intended to replace advice given to you by your health care provider. Make sure you discuss any questions you have with your health care provider.  Document Released: 11/16/2005 Document Revised: 11/06/2015 Document Reviewed: 11/18/2012  Elsevier Interactive Patient Education © 2017 [employer]

## 2017-11-17 NOTE — Progress Notes (Signed)
    Subjective:  Maria Bruce is a 27 y.o. [phone] at 169w1d being seen today for ongoing prenatal care.  She is currently monitored for the following issues for this low-risk pregnancy and has Supervision of other normal pregnancy, antepartum; Late prenatal care affecting pregnancy; No prenatal care in current pregnancy in third trimester; Encounter for fetal anatomic survey; [redacted] weeks gestation of pregnancy; Obesity affecting pregnancy in third trimester; Anemia in pregnancy, third trimester; and Unwanted fertility on their problem list.  Patient reports no complaints.  Contractions: Irregular. Vag. Bleeding: None.  Movement: Present. Denies leaking of fluid.   The following portions of the patient's history were reviewed and updated as appropriate: allergies, current medications, past family history, past medical history, past social history, past surgical history and problem list. Problem list updated.  Objective:   Vitals:   11/17/17 1633  BP: 116/73  Pulse: 91  Weight: 231 lb (104.8 kg)    Fetal Status: Fetal Heart Rate (bpm): 140 Fundal Height: 39 cm Movement: Present     General:  Alert, oriented and cooperative. Patient is in no acute distress.  Skin: Skin is warm and dry. No rash noted.   Cardiovascular: Normal heart rate noted  Respiratory: Normal respiratory effort, no problems with respiration noted  Abdomen: Soft, gravid, appropriate for gestational age. Pain/Pressure: Present     Pelvic:  Cervical exam deferred        Extremities: Normal range of motion.  Edema: None  Mental Status: Normal mood and affect. Normal behavior. Normal judgment and thought content.   Urinalysis:      Assessment and Plan:  Pregnancy: G6P5005 at 6669w1d  1. Supervision of other normal pregnancy, antepartum Discussed option for outpatient BTL after delivery if she delivers prior to 30 days when she signed the BTL consent.  Advised to alert the provider after her birth to get it  scheduled prior to her Postpartum appointment.  She does not want more children.  2. Obesity affecting pregnancy in third trimester  3. Late prenatal care affecting pregnancy in third trimester  4. Anemia during pregnancy in third trimester Lab is pending  - CBC  Term labor symptoms and general obstetric precautions including but not limited to vaginal bleeding, contractions, leaking of fluid and fetal movement were reviewed in detail with the patient. Please refer to After Visit Summary for other counseling recommendations.  Return in about 1 week (around 11/24/2017).  Nolene BernheimERRI BURLESON, RN, MSN, NP-BC Nurse Practitioner, [facility], [facility] 11/17/2017 4:44 PM

## 2017-11-18 LAB — CBC
HEMATOCRIT: 26.7 % — AB (ref 34.0–46.6)
Hemoglobin: 8.3 g/dL — ABNORMAL LOW (ref 11.1–15.9)
MCH: 21.6 pg — ABNORMAL LOW (ref 26.6–33.0)
MCHC: 31.1 g/dL — AB (ref 31.5–35.7)
MCV: 70 fL — ABNORMAL LOW (ref 79–97)
PLATELETS: 253 10*3/uL (ref 150–450)
RBC: 3.84 x10E6/uL (ref 3.77–5.28)
RDW: 15.8 % — AB (ref 12.3–15.4)
WBC: 9.8 10*3/uL (ref 3.4–10.8)

## 2017-11-26 ENCOUNTER — Telehealth (HOSPITAL_COMMUNITY): Payer: Self-pay | Admitting: *Deleted

## 2017-11-26 ENCOUNTER — Ambulatory Visit (INDEPENDENT_AMBULATORY_CARE_PROVIDER_SITE_OTHER): Payer: Medicaid Other | Admitting: Medical

## 2017-11-26 ENCOUNTER — Encounter: Payer: Self-pay | Admitting: Medical

## 2017-11-26 VITALS — BP 112/62 | HR 107 | Wt 231.6 lb

## 2017-11-26 DIAGNOSIS — O99013 Anemia complicating pregnancy, third trimester: Secondary | ICD-10-CM

## 2017-11-26 DIAGNOSIS — O0933 Supervision of pregnancy with insufficient antenatal care, third trimester: Secondary | ICD-10-CM

## 2017-11-26 DIAGNOSIS — Z3009 Encounter for other general counseling and advice on contraception: Secondary | ICD-10-CM

## 2017-11-26 DIAGNOSIS — Z3480 Encounter for supervision of other normal pregnancy, unspecified trimester: Secondary | ICD-10-CM

## 2017-11-26 DIAGNOSIS — Z3483 Encounter for supervision of other normal pregnancy, third trimester: Secondary | ICD-10-CM

## 2017-11-26 NOTE — Progress Notes (Signed)
   PRENATAL VISIT NOTE  Subjective:  Maria Bruce is a 27 y.o. [phone] at 4087w3d being seen today for ongoing prenatal care.  She is currently monitored for the following issues for this low-risk pregnancy and has Supervision of other normal pregnancy, antepartum; Late prenatal care affecting pregnancy; No prenatal care in current pregnancy in third trimester; Encounter for fetal anatomic survey; [redacted] weeks gestation of pregnancy; Obesity affecting pregnancy in third trimester; Anemia in pregnancy, third trimester; and Unwanted fertility on their problem list.  Patient reports occasional contractions.  Contractions: Irregular. Vag. Bleeding: None.  Movement: Present. Denies leaking of fluid.   The following portions of the patient's history were reviewed and updated as appropriate: allergies, current medications, past family history, past medical history, past social history, past surgical history and problem list. Problem list updated.  Objective:   Vitals:   11/26/17 1409  BP: 112/62  Pulse: (!) 107  Weight: 231 lb 9.6 oz (105.1 kg)    Fetal Status: Fetal Heart Rate (bpm): 155 Fundal Height: 39 cm Movement: Present  Presentation: Vertex  General:  Alert, oriented and cooperative. Patient is in no acute distress.  Skin: Skin is warm and dry. No rash noted.   Cardiovascular: Normal heart rate noted  Respiratory: Normal respiratory effort, no problems with respiration noted  Abdomen: Soft, gravid, appropriate for gestational age.  Pain/Pressure: Present     Pelvic: Cervical exam performed Dilation: 1 Effacement (%): 50 Station: -3  Extremities: Normal range of motion.  Edema: None  Mental Status: Normal mood and affect. Normal behavior. Normal judgment and thought content.   Assessment and Plan:  Pregnancy: G6P5005 at 8387w3d  1. Supervision of other normal pregnancy, antepartum - IOL scheduled for 41 weeks   2. Unwanted fertility - desires BTL, paperwork signed 11/04/17 so  may qualify depending on delivery date  3. Late prenatal care affecting pregnancy in third trimester  4. Anemia in pregnancy, third trimester - Advised to increase PO iron to BID since [facility]  Term labor symptoms and general obstetric precautions including but not limited to vaginal bleeding, contractions, leaking of fluid and fetal movement were reviewed in detail with the patient. Please refer to After Visit Summary for other counseling recommendations.  Return in about 1 week (around 12/03/2017) for LOB, NST/BPP.  Future Appointments  Date Time Provider Department Center  12/03/2017  8:30 AM [facility] US 1 [facility]US  12/07/2017  7:30 AM [facility]-B[facility]ED ROOM [facility] None    Vonzella NippleJulie Ediberto Sens, PA-C

## 2017-11-26 NOTE — Patient Instructions (Signed)
Research childbirth classes and hospital preregistration at [email or web address]  Fetal Movement Counts Patient Name: ________________________________________________ Patient Due Date: ____________________ What is a fetal movement count? A fetal movement count is the number of times that you feel your baby move during a certain amount of time. This may also be called a fetal kick count. A fetal movement count is recommended for every pregnant woman. You may be asked to start counting fetal movements as early as week 28 of your pregnancy. Pay attention to when your baby is most active. You may notice your baby's sleep and wake cycles. You may also notice things that make your baby move more. You should do a fetal movement count:  When your baby is normally most active.  At the same time each day.  A good time to count movements is while you are resting, after having something to eat and drink. How do I count fetal movements? 1. Find a quiet, comfortable area. Sit, or lie down on your side. 2. Write down the date, the start time and stop time, and the number of movements that you felt between those two times. Take this information with you to your health care visits. 3. For 2 hours, count kicks, flutters, swishes, rolls, and jabs. You should feel at least 10 movements during 2 hours. 4. You may stop counting after you have felt 10 movements. 5. If you do not feel 10 movements in 2 hours, have something to eat and drink. Then, keep resting and counting for 1 hour. If you feel at least 4 movements during that hour, you may stop counting. Contact a health care provider if:  You feel fewer than 4 movements in 2 hours.  Your baby is not moving like he or she usually does. Date: ____________ Start time: ____________ Stop time: ____________ Movements: ____________ Date: ____________ Start time: ____________ Stop time: ____________ Movements: ____________ Date: ____________ Start time: ____________  Stop time: ____________ Movements: ____________ Date: ____________ Start time: ____________ Stop time: ____________ Movements: ____________ Date: ____________ Start time: ____________ Stop time: ____________ Movements: ____________ Date: ____________ Start time: ____________ Stop time: ____________ Movements: ____________ Date: ____________ Start time: ____________ Stop time: ____________ Movements: ____________ Date: ____________ Start time: ____________ Stop time: ____________ Movements: ____________ Date: ____________ Start time: ____________ Stop time: ____________ Movements: ____________ This information is not intended to replace advice given to you by your health care provider. Make sure you discuss any questions you have with your health care provider. Document Released: 06/24/2006 Document Revised: 01/22/2016 Document Reviewed: 07/04/2015 Elsevier Interactive Patient Education  2018 [employer]  Braxton Hicks Contractions Contractions of the uterus can occur throughout pregnancy, but they are not always a sign that you are in labor. You may have practice contractions called Braxton Hicks contractions. These false labor contractions are sometimes confused with true labor. What are Braxton Hicks contractions? Braxton Hicks contractions are tightening movements that occur in the muscles of the uterus before labor. Unlike true labor contractions, these contractions do not result in opening (dilation) and thinning of the cervix. Toward the end of pregnancy (32-34 weeks), Braxton Hicks contractions can happen more often and may become stronger. These contractions are sometimes difficult to tell apart from true labor because they can be very uncomfortable. You should not feel embarrassed if you go to the hospital with false labor. Sometimes, the only way to tell if you are in true labor is for your health care provider to look for changes in the cervix. The health care provider will   do a physical  exam and may monitor your contractions. If you are not in true labor, the exam should show that your cervix is not dilating and your water has not broken. If there are other health problems associated with your pregnancy, it is completely safe for you to be sent home with false labor. You may continue to have Braxton Hicks contractions until you go into true labor. How to tell the difference between true labor and false labor True labor  Contractions last 30-70 seconds.  Contractions become very regular.  Discomfort is usually felt in the top of the uterus, and it spreads to the lower abdomen and low back.  Contractions do not go away with walking.  Contractions usually become more intense and increase in frequency.  The cervix dilates and gets thinner. False labor  Contractions are usually shorter and not as strong as true labor contractions.  Contractions are usually irregular.  Contractions are often felt in the front of the lower abdomen and in the groin.  Contractions may go away when you walk around or change positions while lying down.  Contractions get weaker and are shorter-lasting as time goes on.  The cervix usually does not dilate or become thin. Follow these instructions at home:  Take over-the-counter and prescription medicines only as told by your health care provider.  Keep up with your usual exercises and follow other instructions from your health care provider.  Eat and drink lightly if you think you are going into labor.  If Braxton Hicks contractions are making you uncomfortable: ? Change your position from lying down or resting to walking, or change from walking to resting. ? Sit and rest in a tub of warm water. ? Drink enough fluid to keep your urine pale yellow. Dehydration may cause these contractions. ? Do slow and deep breathing several times an hour.  Keep all follow-up prenatal visits as told by your health care provider. This is  important. Contact a health care provider if:  You have a fever.  You have continuous pain in your abdomen. Get help right away if:  Your contractions become stronger, more regular, and closer together.  You have fluid leaking or gushing from your vagina.  You pass blood-tinged mucus (bloody show).  You have bleeding from your vagina.  You have low back pain that you never had before.  You feel your baby's head pushing down and causing pelvic pressure.  Your baby is not moving inside you as much as it used to. Summary  Contractions that occur before labor are called Braxton Hicks contractions, false labor, or practice contractions.  Braxton Hicks contractions are usually shorter, weaker, farther apart, and less regular than true labor contractions. True labor contractions usually become progressively stronger and regular and they become more frequent.  Manage discomfort from Braxton Hicks contractions by changing position, resting in a warm bath, drinking plenty of water, or practicing deep breathing. This information is not intended to replace advice given to you by your health care provider. Make sure you discuss any questions you have with your health care provider. Document Released: 10/08/2016 Document Revised: 10/08/2016 Document Reviewed: 10/08/2016 Elsevier Interactive Patient Education  2018 [employer] Inc.    

## 2017-11-29 ENCOUNTER — Encounter (HOSPITAL_COMMUNITY): Payer: Self-pay | Admitting: *Deleted

## 2017-11-29 NOTE — Telephone Encounter (Signed)
Preadmission screen  

## 2017-12-01 ENCOUNTER — Ambulatory Visit (INDEPENDENT_AMBULATORY_CARE_PROVIDER_SITE_OTHER): Payer: Medicaid Other | Admitting: General Practice

## 2017-12-01 ENCOUNTER — Ambulatory Visit: Payer: Self-pay

## 2017-12-01 ENCOUNTER — Ambulatory Visit (INDEPENDENT_AMBULATORY_CARE_PROVIDER_SITE_OTHER): Payer: Medicaid Other | Admitting: Student

## 2017-12-01 DIAGNOSIS — O480 Post-term pregnancy: Secondary | ICD-10-CM

## 2017-12-01 DIAGNOSIS — Z3483 Encounter for supervision of other normal pregnancy, third trimester: Secondary | ICD-10-CM

## 2017-12-01 DIAGNOSIS — O0933 Supervision of pregnancy with insufficient antenatal care, third trimester: Secondary | ICD-10-CM

## 2017-12-01 DIAGNOSIS — O99013 Anemia complicating pregnancy, third trimester: Secondary | ICD-10-CM

## 2017-12-01 DIAGNOSIS — O99213 Obesity complicating pregnancy, third trimester: Secondary | ICD-10-CM

## 2017-12-01 DIAGNOSIS — Z3480 Encounter for supervision of other normal pregnancy, unspecified trimester: Secondary | ICD-10-CM

## 2017-12-01 NOTE — Progress Notes (Signed)
   PRENATAL VISIT NOTE  Subjective:  Maria Bruce is a 27 y.o. [phone] at 3271w1d being seen today for ongoing prenatal care.  She is currently monitored for the following issues for this low-risk pregnancy and has Supervision of other normal pregnancy, antepartum; Late prenatal care affecting pregnancy; No prenatal care in current pregnancy in third trimester; Encounter for fetal anatomic survey; [redacted] weeks gestation of pregnancy; Obesity affecting pregnancy in third trimester; Anemia in pregnancy, third trimester; and Unwanted fertility on their problem list.  Patient reports no complaints.  Contractions: Irregular. Vag. Bleeding: None.  Movement: Present. Denies leaking of fluid.   The following portions of the patient's history were reviewed and updated as appropriate: allergies, current medications, past family history, past medical history, past social history, past surgical history and problem list. Problem list updated.  Objective:   Vitals:   12/01/17 1004  BP: 108/67  Pulse: 94  Weight: 233 lb (105.7 kg)    Fetal Status:     Movement: Present     General:  Alert, oriented and cooperative. Patient is in no acute distress.  Skin: Skin is warm and dry. No rash noted.   Cardiovascular: Normal heart rate noted  Respiratory: Normal respiratory effort, no problems with respiration noted  Abdomen: Soft, gravid, appropriate for gestational age.  Pain/Pressure: Present     Pelvic: Cervical exam deferred        Extremities: Normal range of motion.  Edema: None  Mental Status: Normal mood and affect. Normal behavior. Normal judgment and thought content.   Assessment and Plan:  Pregnancy: G6P5005 at 7271w1d  1. Supervision of other normal pregnancy, antepartum -scheduled for IOL 7/2. IOL orders already placed -Reactive NST today  Term labor symptoms and general obstetric precautions including but not limited to vaginal bleeding, contractions, leaking of fluid and fetal movement  were reviewed in detail with the patient. Please refer to After Visit Summary for other counseling recommendations.  No follow-ups on file.  Future Appointments  Date Time Provider Department Center  12/03/2017  8:30 AM [facility] US 1 [facility]-US  12/07/2017  7:30 AM [facility]-BSSCHED ROOM [facility]ED None    Judeth HornErin Lyndal Reggio, NP

## 2017-12-01 NOTE — Progress Notes (Signed)
Pt informed that the ultrasound is considered a limited OB ultrasound and is not intended to be a complete ultrasound exam.  Patient also informed that the ultrasound is not being completed with the intent of assessing for fetal or placental anomalies or any pelvic abnormalities.  Explained that the purpose of today's ultrasound is to assess for  BPP, presentation and AFI.  Patient acknowledges the purpose of the exam and the limitations of the study.    

## 2017-12-01 NOTE — Patient Instructions (Signed)
Labor Induction Labor induction is when steps are taken to cause a pregnant woman to begin the labor process. Most women go into labor on their own between 37 weeks and 42 weeks of the pregnancy. When this does not happen or when there is a medical need, methods may be used to induce labor. Labor induction causes a pregnant woman's uterus to contract. It also causes the cervix to soften (ripen), open (dilate), and thin out (efface). Usually, labor is not induced before 39 weeks of the pregnancy unless there is a problem with the baby or mother. Before inducing labor, your health care provider will consider a number of factors, including the following:  The medical condition of you and the baby.  How many weeks along you are.  The status of the baby's lung maturity.  The condition of the cervix.  The position of the baby. What are the reasons for labor induction? Labor may be induced for the following reasons:  The health of the baby or mother is at risk.  The pregnancy is overdue by 1 week or more.  The water breaks but labor does not start on its own.  The mother has a health condition or serious illness, such as high blood pressure, infection, placental abruption, or diabetes.  The amniotic fluid amounts are low around the baby.  The baby is distressed. Convenience or wanting the baby to be born on a certain date is not a reason for inducing labor. What methods are used for labor induction? Several methods of labor induction may be used, such as:  Prostaglandin medicine. This medicine causes the cervix to dilate and ripen. The medicine will also start contractions. It can be taken by mouth or by inserting a suppository into the vagina.  Inserting a thin tube (catheter) with a balloon on the end into the vagina to dilate the cervix. Once inserted, the balloon is expanded with water, which causes the cervix to open.  Stripping the membranes. Your health care provider separates  amniotic sac tissue from the cervix, causing the cervix to be stretched and causing the release of a hormone called progesterone. This may cause the uterus to contract. It is often done during an office visit. You will be sent home to wait for the contractions to begin. You will then come in for an induction.  Breaking the water. Your health care provider makes a hole in the amniotic sac using a small instrument. Once the amniotic sac breaks, contractions should begin. This may still take hours to see an effect.  Medicine to trigger or strengthen contractions. This medicine is given through an IV access tube inserted into a vein in your arm. All of the methods of induction, besides stripping the membranes, will be done in the hospital. Induction is done in the hospital so that you and the baby can be carefully monitored. How long does it take for labor to be induced? Some inductions can take up to 2-3 days. Depending on the cervix, it usually takes less time. It takes longer when you are induced early in the pregnancy or if this is your first pregnancy. If a mother is still pregnant and the induction has been going on for 2-3 days, either the mother will be sent home or a cesarean delivery will be needed. What are the risks associated with labor induction? Some of the risks of induction include:  Changes in fetal heart rate, such as too high, too low, or erratic.  Fetal distress.    Chance of infection for the mother and baby.  Increased chance of having a cesarean delivery.  Breaking off (abruption) of the placenta from the uterus (rare).  Uterine rupture (very rare). When induction is needed for medical reasons, the benefits of induction may outweigh the risks. What are some reasons for not inducing labor? Labor induction should not be done if:  It is shown that your baby does not tolerate labor.  You have had previous surgeries on your uterus, such as a myomectomy or the removal of  fibroids.  Your placenta lies very low in the uterus and blocks the opening of the cervix (placenta previa).  Your baby is not in a head-down position.  The umbilical cord drops down into the birth canal in front of the baby. This could cut off the baby's blood and oxygen supply.  You have had a previous cesarean delivery.  There are unusual circumstances, such as the baby being extremely premature. This information is not intended to replace advice given to you by your health care provider. Make sure you discuss any questions you have with your health care provider. Document Released: 10/14/2006 Document Revised: 10/31/2015 Document Reviewed: 12/22/2012 Elsevier Interactive Patient Education  2017 [employer] Inc.  

## 2017-12-03 ENCOUNTER — Encounter (HOSPITAL_COMMUNITY): Payer: Self-pay

## 2017-12-03 ENCOUNTER — Ambulatory Visit (HOSPITAL_COMMUNITY)
Admission: RE | Admit: 2017-12-03 | Discharge: 2017-12-03 | Disposition: A | Discharge status: Home / Self Care | Payer: Medicaid Other | Source: Ambulatory Visit | Attending: Student | Admitting: Student

## 2017-12-03 DIAGNOSIS — O99213 Obesity complicating pregnancy, third trimester: Secondary | ICD-10-CM | POA: Diagnosis not present

## 2017-12-03 DIAGNOSIS — O480 Post-term pregnancy: Secondary | ICD-10-CM | POA: Insufficient documentation

## 2017-12-03 DIAGNOSIS — O0930 Supervision of pregnancy with insufficient antenatal care, unspecified trimester: Secondary | ICD-10-CM | POA: Insufficient documentation

## 2017-12-03 DIAGNOSIS — Z363 Encounter for antenatal screening for malformations: Secondary | ICD-10-CM | POA: Insufficient documentation

## 2017-12-03 DIAGNOSIS — Z3A40 40 weeks gestation of pregnancy: Secondary | ICD-10-CM | POA: Insufficient documentation

## 2017-12-07 ENCOUNTER — Encounter (HOSPITAL_COMMUNITY)
Admission: RE | Disposition: A | Discharge status: Home / Self Care | Payer: Self-pay | Source: Home / Self Care | Attending: Family Medicine

## 2017-12-07 ENCOUNTER — Inpatient Hospital Stay (HOSPITAL_COMMUNITY): Payer: Medicaid Other | Admitting: Anesthesiology

## 2017-12-07 ENCOUNTER — Other Ambulatory Visit: Payer: Self-pay

## 2017-12-07 ENCOUNTER — Inpatient Hospital Stay (HOSPITAL_COMMUNITY)
Admission: RE | Admit: 2017-12-07 | Discharge: 2017-12-09 | DRG: 807 | Disposition: A | Discharge status: Home / Self Care | Payer: Medicaid Other | Attending: Family Medicine | Admitting: Family Medicine

## 2017-12-07 ENCOUNTER — Encounter (HOSPITAL_COMMUNITY): Payer: Self-pay

## 2017-12-07 DIAGNOSIS — Z3480 Encounter for supervision of other normal pregnancy, unspecified trimester: Secondary | ICD-10-CM

## 2017-12-07 DIAGNOSIS — Z3009 Encounter for other general counseling and advice on contraception: Secondary | ICD-10-CM

## 2017-12-07 DIAGNOSIS — D649 Anemia, unspecified: Secondary | ICD-10-CM | POA: Diagnosis present

## 2017-12-07 DIAGNOSIS — Z3A41 41 weeks gestation of pregnancy: Secondary | ICD-10-CM | POA: Diagnosis not present

## 2017-12-07 DIAGNOSIS — O99214 Obesity complicating childbirth: Secondary | ICD-10-CM | POA: Diagnosis not present

## 2017-12-07 DIAGNOSIS — O480 Post-term pregnancy: Secondary | ICD-10-CM | POA: Diagnosis present

## 2017-12-07 DIAGNOSIS — O9902 Anemia complicating childbirth: Secondary | ICD-10-CM | POA: Diagnosis not present

## 2017-12-07 DIAGNOSIS — O0933 Supervision of pregnancy with insufficient antenatal care, third trimester: Secondary | ICD-10-CM

## 2017-12-07 LAB — CBC
HEMATOCRIT: 26.7 % — AB (ref 36.0–46.0)
Hemoglobin: 8.1 g/dL — ABNORMAL LOW (ref 12.0–15.0)
MCH: 21.8 pg — AB (ref 26.0–34.0)
MCHC: 30.3 g/dL (ref 30.0–36.0)
MCV: 71.8 fL — AB (ref 78.0–100.0)
Platelets: 274 10*3/uL (ref 150–400)
RBC: 3.72 MIL/uL — ABNORMAL LOW (ref 3.87–5.11)
RDW: 16.4 % — AB (ref 11.5–15.5)
WBC: 10 10*3/uL (ref 4.0–10.5)

## 2017-12-07 LAB — TYPE AND SCREEN
ABO/RH(D): B POS
ANTIBODY SCREEN: NEGATIVE

## 2017-12-07 LAB — RPR: RPR: NONREACTIVE

## 2017-12-07 SURGERY — LIGATION, FALLOPIAN TUBE, POSTPARTUM
Anesthesia: Epidural | Laterality: Bilateral

## 2017-12-07 MED ORDER — TETANUS-DIPHTH-ACELL PERTUSSIS 5-2.5-18.5 LF-MCG/0.5 IM SUSP: 0.5000 mL | Freq: Once | INTRAMUSCULAR | Status: DC

## 2017-12-07 MED ORDER — EPHEDRINE 5 MG/ML INJ: 10.0000 mg | INTRAVENOUS | Status: DC | PRN

## 2017-12-07 MED ORDER — DIPHENHYDRAMINE HCL 25 MG PO CAPS: 25.0000 mg | ORAL_CAPSULE | Freq: Four times a day (QID) | ORAL | Status: DC | PRN

## 2017-12-07 MED ORDER — SOD CITRATE-CITRIC ACID 500-334 MG/5ML PO SOLN: 30.0000 mL | ORAL | Status: DC | PRN

## 2017-12-07 MED ORDER — FAMOTIDINE 20 MG PO TABS
20.0000 mg | ORAL_TABLET | Freq: Two times a day (BID) | ORAL | Status: DC
  Administered 2017-12-08 – 2017-12-09 (×2): 20 mg via ORAL
  Filled 2017-12-07 (×3): qty 1

## 2017-12-07 MED ORDER — ONDANSETRON HCL 4 MG/2ML IJ SOLN: 4.0000 mg | INTRAMUSCULAR | Status: DC | PRN

## 2017-12-07 MED ORDER — ACETAMINOPHEN 325 MG PO TABS
650.0000 mg | ORAL_TABLET | ORAL | Status: DC | PRN
Start: 2017-12-07 — End: 2017-12-09
  Administered 2017-12-08 – 2017-12-09 (×6): 650 mg via ORAL
  Filled 2017-12-07 (×6): qty 2

## 2017-12-07 MED ORDER — LIDOCAINE-EPINEPHRINE 2 %-1:100000 IJ SOLN
INTRAMUSCULAR | Status: DC | PRN
  Administered 2017-12-07: 5 mL

## 2017-12-07 MED ORDER — OXYTOCIN 40 UNITS IN LACTATED RINGERS INFUSION - SIMPLE MED
2.5000 [IU]/h | INTRAVENOUS | Status: DC
  Administered 2017-12-07: 2.5 [IU]/h via INTRAVENOUS
  Filled 2017-12-07: qty 1000

## 2017-12-07 MED ORDER — LIDOCAINE HCL (PF) 1 % IJ SOLN: 30.0000 mL | INTRAMUSCULAR | Status: DC | PRN

## 2017-12-07 MED ORDER — OXYTOCIN 40 UNITS IN LACTATED RINGERS INFUSION - SIMPLE MED
1.0000 m[IU]/min | INTRAVENOUS | Status: DC
  Administered 2017-12-07: 8 m[IU]/min via INTRAVENOUS
  Administered 2017-12-07: 6 m[IU]/min via INTRAVENOUS
  Administered 2017-12-07: 2 m[IU]/min via INTRAVENOUS

## 2017-12-07 MED ORDER — PHENYLEPHRINE 40 MCG/ML (10ML) SYRINGE FOR IV PUSH (FOR BLOOD PRESSURE SUPPORT)
80.0000 ug | PREFILLED_SYRINGE | INTRAVENOUS | Status: DC | PRN
  Filled 2017-12-07: qty 10

## 2017-12-07 MED ORDER — OXYTOCIN BOLUS FROM INFUSION
500.0000 mL | Freq: Once | INTRAVENOUS | Status: AC
  Administered 2017-12-07: 500 mL via INTRAVENOUS

## 2017-12-07 MED ORDER — IBUPROFEN 600 MG PO TABS
600.0000 mg | ORAL_TABLET | Freq: Four times a day (QID) | ORAL | Status: DC
  Administered 2017-12-07 – 2017-12-09 (×8): 600 mg via ORAL
  Filled 2017-12-07 (×8): qty 1

## 2017-12-07 MED ORDER — PRENATAL MULTIVITAMIN CH
1.0000 | ORAL_TABLET | Freq: Every day | ORAL | Status: DC
  Administered 2017-12-08 – 2017-12-09 (×2): 1 via ORAL
  Filled 2017-12-07 (×2): qty 1

## 2017-12-07 MED ORDER — MISOPROSTOL 25 MCG QUARTER TABLET
25.0000 ug | ORAL_TABLET | ORAL | Status: DC | PRN
  Administered 2017-12-07: 25 ug via VAGINAL
  Filled 2017-12-07: qty 1

## 2017-12-07 MED ORDER — LIDOCAINE HCL (PF) 1 % IJ SOLN
INTRAMUSCULAR | Status: DC | PRN
  Administered 2017-12-07: 4 mL via EPIDURAL
  Administered 2017-12-07: 6 mL via EPIDURAL
  Administered 2017-12-07: 4 mL via EPIDURAL
  Administered 2017-12-07: 6 mL via EPIDURAL

## 2017-12-07 MED ORDER — LACTATED RINGERS IV SOLN: 500.0000 mL | INTRAVENOUS | Status: DC | PRN

## 2017-12-07 MED ORDER — LACTATED RINGERS IV SOLN
500.0000 mL | Freq: Once | INTRAVENOUS | Status: AC
  Administered 2017-12-07: 500 mL via INTRAVENOUS

## 2017-12-07 MED ORDER — PHENYLEPHRINE 40 MCG/ML (10ML) SYRINGE FOR IV PUSH (FOR BLOOD PRESSURE SUPPORT): 80.0000 ug | PREFILLED_SYRINGE | INTRAVENOUS | Status: DC | PRN

## 2017-12-07 MED ORDER — OXYCODONE-ACETAMINOPHEN 5-325 MG PO TABS: 1.0000 | ORAL_TABLET | ORAL | Status: DC | PRN

## 2017-12-07 MED ORDER — FENTANYL 2.5 MCG/ML BUPIVACAINE 1/10 % EPIDURAL INFUSION (WH - ANES)
14.0000 mL/h | INTRAMUSCULAR | Status: DC | PRN
  Administered 2017-12-07: 14 mL/h via EPIDURAL
  Filled 2017-12-07: qty 100

## 2017-12-07 MED ORDER — TERBUTALINE SULFATE 1 MG/ML IJ SOLN: 0.2500 mg | Freq: Once | INTRAMUSCULAR | Status: DC | PRN

## 2017-12-07 MED ORDER — ACETAMINOPHEN 325 MG PO TABS: 650.0000 mg | ORAL_TABLET | ORAL | Status: DC | PRN

## 2017-12-07 MED ORDER — WITCH HAZEL-GLYCERIN EX PADS: 1.0000 "application " | MEDICATED_PAD | CUTANEOUS | Status: DC | PRN

## 2017-12-07 MED ORDER — LACTATED RINGERS IV SOLN
INTRAVENOUS | Status: DC
  Administered 2017-12-07 (×2): via INTRAVENOUS

## 2017-12-07 MED ORDER — SIMETHICONE 80 MG PO CHEW: 80.0000 mg | CHEWABLE_TABLET | ORAL | Status: DC | PRN

## 2017-12-07 MED ORDER — DIBUCAINE 1 % RE OINT: 1.0000 "application " | TOPICAL_OINTMENT | RECTAL | Status: DC | PRN

## 2017-12-07 MED ORDER — COCONUT OIL OIL: 1.0000 "application " | TOPICAL_OIL | Status: DC | PRN

## 2017-12-07 MED ORDER — ONDANSETRON HCL 4 MG/2ML IJ SOLN: 4.0000 mg | Freq: Four times a day (QID) | INTRAMUSCULAR | Status: DC | PRN

## 2017-12-07 MED ORDER — DIPHENHYDRAMINE HCL 50 MG/ML IJ SOLN: 12.5000 mg | INTRAMUSCULAR | Status: DC | PRN

## 2017-12-07 MED ORDER — ZOLPIDEM TARTRATE 5 MG PO TABS: 5.0000 mg | ORAL_TABLET | Freq: Every evening | ORAL | Status: DC | PRN

## 2017-12-07 MED ORDER — OXYCODONE-ACETAMINOPHEN 5-325 MG PO TABS: 2.0000 | ORAL_TABLET | ORAL | Status: DC | PRN

## 2017-12-07 MED ORDER — SENNOSIDES-DOCUSATE SODIUM 8.6-50 MG PO TABS
2.0000 | ORAL_TABLET | ORAL | Status: DC
  Administered 2017-12-08 (×2): 2 via ORAL
  Filled 2017-12-07 (×2): qty 2

## 2017-12-07 MED ORDER — ONDANSETRON HCL 4 MG PO TABS: 4.0000 mg | ORAL_TABLET | ORAL | Status: DC | PRN

## 2017-12-07 MED ORDER — FLEET ENEMA 7-19 GM/118ML RE ENEM: 1.0000 | ENEMA | RECTAL | Status: DC | PRN

## 2017-12-07 MED ORDER — BENZOCAINE-MENTHOL 20-0.5 % EX AERO: 1.0000 "application " | INHALATION_SPRAY | CUTANEOUS | Status: DC | PRN

## 2017-12-07 NOTE — Anesthesia Preprocedure Evaluation (Signed)
Anesthesia Evaluation  Patient identified by MRN, date of birth, ID band Patient awake    Reviewed: Allergy & Precautions, H&P , NPO status , Patient's Chart, lab work & pertinent test results  Airway Mallampati: III  TM Distance: >3 FB Neck ROM: Full    Dental   Pulmonary asthma ,    Pulmonary exam normal breath sounds clear to auscultation       Cardiovascular negative cardio ROS Normal cardiovascular exam Rhythm:Regular Rate:Normal     Neuro/Psych negative neurological ROS  negative psych ROS   GI/Hepatic negative GI ROS, Neg liver ROS,   Endo/Other  Morbid obesity  Renal/GU negative Renal ROS  negative genitourinary   Musculoskeletal   Abdominal (+) + obese,   Peds  Hematology  (+) anemia ,   Anesthesia Other Findings   Reproductive/Obstetrics (+) Pregnancy                             Anesthesia Physical  Anesthesia Plan  ASA: III  Anesthesia Plan: Epidural   Post-op Pain Management:    Induction:   PONV Risk Score and Plan:   Airway Management Planned: Natural Airway  Additional Equipment: None  Intra-op Plan:   Post-operative Plan:   Informed Consent: I have reviewed the patients History and Physical, chart, labs and discussed the procedure including the risks, benefits and alternatives for the proposed anesthesia with the patient or authorized representative who has indicated his/her understanding and acceptance.     Plan Discussed with: Anesthesiologist  Anesthesia Plan Comments: (Labs reviewed. Platelets acceptable, patient not taking any blood thinning medications. Risks and benefits discussed with patient, patient expressed understanding and wished to proceed.)        Anesthesia Quick Evaluation

## 2017-12-07 NOTE — H&P (Signed)
LABOR AND DELIVERY ADMISSION HISTORY AND PHYSICAL NOTE  Maria Bruce is a 27 y.o. female [phone] with IUP at [redacted]w[redacted]d by U/S (@35  wks) presenting for IOL for postdates.  She reports positive fetal movement. She denies leakage of fluid or vaginal bleeding.  Prenatal History/Complications: PNC at [facility] (started @35  wks) Pregnancy complications:  - Late to care - Anemia in pregnancy - Obesity affecting pregnancy  Past Medical History: Past Medical History:  Diagnosis Date  . Asthma    as a child    Past Surgical History: Past Surgical History:  Procedure Laterality Date  . NO PAST SURGERIES      Obstetrical History: OB History    Gravida  6   Para  5   Term  5   Preterm  0   AB  0   Living  5     SAB  0   TAB  0   Ectopic  0   Multiple  0   Live Births  5           Social History: Social History   Socioeconomic History  . Marital status: Single    Spouse name: Not on file  . Number of children: Not on file  . Years of education: Not on file  . Highest education level: Not on file  Occupational History  . Not on file  Social Needs  . Financial resource strain: Not on file  . Food insecurity:    Worry: Not on file    Inability: Not on file  . Transportation needs:    Medical: Not on file    Non-medical: Not on file  Tobacco Use  . Smoking status: Never Smoker  . Smokeless tobacco: Never Used  Substance and Sexual Activity  . Alcohol use: No  . Drug use: No  . Sexual activity: Yes    Birth control/protection: None  Lifestyle  . Physical activity:    Days per week: Not on file    Minutes per session: Not on file  . Stress: Not on file  Relationships  . Social connections:    Talks on phone: Not on file    Gets together: Not on file    Attends religious service: Not on file    Active member of club or organization: Not on file    Attends meetings of clubs or organizations: Not on file    Relationship status: Not on file   Other Topics Concern  . Not on file  Social History Narrative  . Not on file    Family History: Family History  Problem Relation Age of Onset  . Heart attack Mother   . Hypertension Paternal Grandmother   . Diabetes Paternal Grandmother     Allergies: No Known Allergies  Medications Prior to Admission  Medication Sig Dispense Refill Last Dose  . famotidine (PEPCID) 20 MG tablet Take 1 tablet (20 mg total) by mouth 2 (two) times daily. 60 tablet 0 12/06/2017 at Unknown time  . Ferrous Fumarate-Folic Acid 324-1 MG TABS Take 1 tablet by mouth 2 (two) times daily. 60 each 0 12/06/2017 at Unknown time  . Prenatal Vit-Fe Fumarate-FA (PRENATAL MULTIVITAMIN) TABS tablet Take 1 tablet by mouth daily at 12 noon.   12/06/2017 at Unknown time     Review of Systems  All systems reviewed and negative except as stated in HPI  Physical Exam Blood pressure 121/66, pulse 92, temperature 98.6 F (37 C), temperature source Oral, resp. rate 18, height  5\' 4"  (1.626 m), weight 235 lb 4.8 oz (106.7 kg), last menstrual period 03/17/2017  General appearance: alert, cooperative and no distress Lungs: clear to auscultation bilaterally Heart: regular rate and rhythm Abdomen: soft, non-tender; bowel sounds normal Extremities: No calf swelling or tenderness Presentation: cephalic Fetal monitoring: 145 bpm / moderate variability / accels present / decels absent  Uterine activity: irregular UC's Dilation: 2 Effacement (%): 50 Station: -2 Exam by:: Carloyn Jaeger. Anjel Perfetti, CNM Cervical Balloon inserted without difficulty; pt tolerated well  Prenatal labs: ABO, Rh:  B Positive Antibody:   Rubella: Immune (05/26 1935) RPR: Non Reactive (05/26 1935)  HBsAg: Negative (05/26 1935)  HIV: Non Reactive (05/26 1935)  GC/Chlamydia: Neg/Neg GBS: Negative (05/30 0000)  1 hr Glucola: Normal - 116 Genetic screening:  Reduced SMA Carrier Risk Anatomy US: normal  Prenatal Transfer Tool  Maternal Diabetes: No Genetic  Screening: Abnormal:  Results: Other: Reduced SMA Carrier Risk Maternal Ultrasounds/Referrals: Normal Fetal Ultrasounds or other Referrals:  None Maternal Substance Abuse:  No Significant Maternal Medications:  None Significant Maternal Lab Results: Lab values include: Group B Strep negative  Results for orders placed or performed during the hospital encounter of 12/07/17 (from the past 24 hour(s))  CBC   Collection Time: 12/07/17  8:03 AM  Result Value Ref Range   WBC 10.0 4.0 - 10.5 K/uL   RBC 3.72 (L) 3.87 - 5.11 MIL/uL   Hemoglobin 8.1 (L) 12.0 - 15.0 g/dL   HCT 21.326.7 (L) 08.636.0 - 57.846.0 %   MCV 71.8 (L) 78.0 - 100.0 fL   MCH 21.8 (L) 26.0 - 34.0 pg   MCHC 30.3 30.0 - 36.0 g/dL   RDW 46.916.4 (H) 62.911.5 - 52.815.5 %   Platelets 274 150 - 400 K/uL    Patient Active Problem List   Diagnosis Date Noted  . Post-dates pregnancy 12/07/2017  . Unwanted fertility 11/17/2017  . Anemia in pregnancy, third trimester 11/10/2017  . No prenatal care in current pregnancy in third trimester   . Encounter for fetal anatomic survey   . [redacted] weeks gestation of pregnancy   . Obesity affecting pregnancy in third trimester   . Supervision of other normal pregnancy, antepartum 11/04/2017  . Late prenatal care affecting pregnancy 11/04/2017    Assessment: Maria DandyKimberly Rosa Bruce is a 27 y.o. [id number] at 1638w0d here for IOL for postdates pregnancy.  #Labor: IOL #Pain: Planning epidural; mild cramping at this time #FWB: Category 1 #ID:  n/a #MOF: bottle #MOC: PP BTL #Circ:  n/a  Raelyn Moraawson, Roshell Brigham, CNM 12/07/2017, 9:11 AM

## 2017-12-07 NOTE — Anesthesia Procedure Notes (Signed)
Dural Puncture Epidural Patient location during procedure: OB Start time: 12/07/2017 5:14 PM End time: 12/07/2017 5:24 PM  Staffing Anesthesiologist: Beryle LatheBrock, Thomas E, MD Performed: anesthesiologist   Preanesthetic Checklist Completed: patient identified, pre-op evaluation, timeout performed, IV checked, risks and benefits discussed and monitors and equipment checked  Epidural Patient position: sitting Prep: DuraPrep Patient monitoring: continuous pulse ox and blood pressure Approach: right paramedian Location: L2-L3 Injection technique: LOR air  Needle:  Needle type: Tuohy  Needle gauge: 17 G Needle length: 15 cm Needle insertion depth: 9 cm Catheter size: 19 Gauge Catheter at skin depth: 15 cm Test dose: negative and Other (1% lidocaine)  Additional Notes Patient identified. Risks including, but not limited to, bleeding, infection, nerve damage, paralysis, inadequate analgesia, blood pressure changes, nausea, vomiting, allergic reaction, postpartum back pain, itching, and headache were discussed. Patient expressed understanding and wished to proceed. Sterile prep and drape, including hand hygiene, mask, and sterile gloves were used. The patient was positioned and the spine was prepped. The skin was anesthetized with lidocaine. Unsuccessful with first attempt midline due to poor patient positioning despite multiple attempted corrections. Second attempt right paramedian successful. 24ga pencan needle passed through Tuohy to confirm CSF after achieving LOR, removed prior to catheter placement. No paraesthesia or other complication noted. The patient did not experience any signs of intravascular injection such as tinnitus or metallic taste in mouth, nor signs of intrathecal spread such as rapid motor block. Please see nursing notes for vital signs. The patient tolerated the procedure well.   Leslye Peerhomas Brock, MDReason for block:procedure for pain

## 2017-12-07 NOTE — Anesthesia Pain Management Evaluation Note (Signed)
  CRNA Pain Management Visit Note  Patient: Maria Bruce, 27 y.o., female  "Hello I am a [facility] of the anesthesia [facility]. We have an anesthesia team available at all times to provide care throughout the hospital, including epidural management and anesthesia for C-section. I don't know your plan for the delivery whether it a natural birth, water birth, IV sedation, nitrous supplementation, doula or epidural, but we want to meet your pain goals."   1.Was your pain managed to your expectations on prior hospitalizations?   Yes   2.What is your expectation for pain management during this hospitalization?     Epidural  3.How can we help you reach that goal? unsure  Record the patient's initial score and the patient's pain goal.   Pain: 6  Pain Goal: 8 [facility] wants you to be able to say your pain was always managed very well.  Cephus ShellingBURGER,Sabastion Hrdlicka 12/07/2017

## 2017-12-07 NOTE — Progress Notes (Signed)
Maria Bruce is a 27 y.o. [id number] at 68107w0d  admitted for induction of labor due to Post dates.   Subjective:   Patient was 11/10/68%/-2.  Artificial rupture of membrane was performed without difficulty.  Moderate amount of fluid was expelled.     Objective: BP 130/69   Pulse (!) 103   Temp 98.1 F (36.7 C) (Oral)   Resp 20   Ht 5\' 4"  (1.626 m)   Wt 106.7 kg (235 lb 4.8 oz)   LMP 03/17/2017   SpO2 100%   BMI 40.39 kg/m  No intake/output data recorded. No intake/output data recorded.  FHT:  FHR: 145 bpm, variability: moderate,  accelerations:  Present,  decelerations:  Absent UC:   regular, every 2-3 minutes SVE:   Dilation: 6.5 Effacement (%): 70 Station: -2 Exam by:: Carloyn Jaeger. Dawson, CNM  Labs: Lab Results  Component Value Date   WBC 10.0 12/07/2017   HGB 8.1 (L) 12/07/2017   HCT 26.7 (L) 12/07/2017   MCV 71.8 (L) 12/07/2017   PLT 274 12/07/2017    Assessment / Plan: Induction of labor due to postterm,  progressing well on pitocin  Labor: Progressing normally and AROM performed.  Fetal Wellbeing:  Category I Pain Control:  Epidural I/D:  n/a Anticipated MOD:  NSVD  Maria Bruce 12/07/2017, 3:42 PM

## 2017-12-07 NOTE — Anesthesia Procedure Notes (Signed)
Epidural Patient location during procedure: OB Start time: 12/07/2017 1:47 PM End time: 12/07/2017 1:53 PM  Staffing Anesthesiologist: Beryle LatheBrock, Thomas E, MD Performed: anesthesiologist   Preanesthetic Checklist Completed: patient identified, pre-op evaluation, timeout performed, IV checked, risks and benefits discussed and monitors and equipment checked  Epidural Patient position: sitting Prep: DuraPrep Patient monitoring: continuous pulse ox and blood pressure Approach: midline Location: L3-L4 Injection technique: LOR saline  Needle:  Needle type: Tuohy  Needle gauge: 17 G Needle length: 9 cm Needle insertion depth: 9 cm Catheter size: 19 Gauge Catheter at skin depth: 15 cm Test dose: negative and Other (1% lidocaine)  Additional Notes Patient identified. Risks including, but not limited to, bleeding, infection, nerve damage, paralysis, inadequate analgesia, blood pressure changes, nausea, vomiting, allergic reaction, postpartum back pain, itching, and headache were discussed. Patient expressed understanding and wished to proceed. Sterile prep and drape, including hand hygiene, mask, and sterile gloves were used. The patient was positioned and the spine was prepped. The skin was anesthetized with lidocaine. No paraesthesia or other complication noted. The patient did not experience any signs of intravascular injection such as tinnitus or metallic taste in mouth, nor signs of intrathecal spread such as rapid motor block. Please see nursing notes for vital signs. The patient tolerated the procedure well.   Leslye Peerhomas Brock, MDReason for block:procedure for pain

## 2017-12-08 LAB — CBC
HCT: 26 % — ABNORMAL LOW (ref 36.0–46.0)
HEMOGLOBIN: 7.6 g/dL — AB (ref 12.0–15.0)
MCH: 21.1 pg — AB (ref 26.0–34.0)
MCHC: 29.2 g/dL — ABNORMAL LOW (ref 30.0–36.0)
MCV: 72 fL — ABNORMAL LOW (ref 78.0–100.0)
Platelets: 257 10*3/uL (ref 150–400)
RBC: 3.61 MIL/uL — ABNORMAL LOW (ref 3.87–5.11)
RDW: 16.7 % — ABNORMAL HIGH (ref 11.5–15.5)
WBC: 14.1 10*3/uL — ABNORMAL HIGH (ref 4.0–10.5)

## 2017-12-08 NOTE — Progress Notes (Signed)
POSTPARTUM PROGRESS NOTE  Post Partum Day 1 Subjective:  Maria Bruce is a 27 y.o. [phone] 9660w0d s/p SVD.  No acute events overnight.  Pt denies problems with ambulating, voiding or po intake.    Pain is well controlled. Lochia Minimal.   Objective: Blood pressure 109/62, pulse 78, temperature 98.2 F (36.8 C), temperature source Oral, resp. rate 18, height 5\' 4"  (1.626 m), weight 235 lb 4.8 oz (106.7 kg), last menstrual period 03/17/2017, SpO2 99 %, unknown if currently breastfeeding.  Physical Exam:  General: alert, cooperative and no distress Lochia:normal flow Chest: no respiratory distress Heart:regular rate, distal pulses intact Abdomen: soft, nontender,  Uterine Fundus: firm, appropriately tender DVT Evaluation: No calf swelling or tenderness Extremities: no edema   Recent Labs    12/07/17 0803 12/08/17 0612  HGB 8.1* 7.6*  HCT 26.7* 26.0*    Assessment/Plan:  ASSESSMENT: Maria Bruce is a 27 y.o. [id number] 4660w0d s/p SVD, doing well with no concerns. Although previously expressed interest in BTL with signed consent, she is having second thoughts regarding the permanence of the procedure. She now desires Nexplanon. Plan for discharge tomorrow.   LOS: 1 day   Kingsley Spittlelison RumballDO 12/08/2017, 6:58 AM

## 2017-12-08 NOTE — Anesthesia Postprocedure Evaluation (Signed)
Anesthesia Post Note  Patient: Maria Bruce  Procedure(s) Performed: AN AD HOC LABOR EPIDURAL     Patient location during evaluation: Mother Baby Anesthesia Type: Epidural Pain management: pain level controlled Vital Signs Assessment: post-procedure vital signs reviewed and stable Respiratory status: spontaneous breathing Cardiovascular status: stable Postop Assessment: no headache, no backache, epidural receding, patient able to bend at knees, adequate PO intake, no apparent nausea or vomiting and able to ambulate Anesthetic complications: no    Last Vitals:  Vitals:   12/08/17 0130 12/08/17 0500  BP: 108/60 109/62  Pulse: 71 78  Resp: 18 18  Temp: 36.8 C 36.8 C  SpO2: 100% 99%    Last Pain:  Vitals:   12/08/17 0500  TempSrc: Oral  PainSc: 7    Pain Goal: Patients Stated Pain Goal: 0 (12/08/17 0130)               Ethel Meisenheimer

## 2017-12-08 NOTE — Progress Notes (Signed)
CSW acknowledges consult and completed chart review.  MOB has a hx of NPNC and Late PNC. [facility] had [facility] on 03/18/12, 09/29/13, 10/25/14, and 07/17/16 (see progress notes by various CSWs). CSW will monitor UDS and CDS results.  CSW will make report to Child Protective Services if warranted.    Please consult CSW if concerns arise or by MOB's request.  Blaine HamperAngel Boyd-Gilyard, MSW, LCSW Clinical Social Work [phone]

## 2017-12-08 NOTE — Progress Notes (Signed)
CSW acknowledges consult and completed chart review.  MOB has a hx of NPNC and Late PNC. MOB had CSW consults for LPNC and NPCN on 03/18/12, 09/29/13, 10/25/14, and 07/17/16 (see progress notes by various CSWs). CSW will monitor UDS and CDS results.  CSW will make report to Child Protective Services if warranted.    Please consult CSW if concerns arise or by MOB's request.  Shaia Porath Boyd-Gilyard, MSW, LCSW Clinical Social Work [phone] 

## 2017-12-09 MED ORDER — IBUPROFEN 600 MG PO TABS: 600.0000 mg | ORAL_TABLET | Freq: Four times a day (QID) | ORAL | 0 refills | Status: DC

## 2017-12-09 NOTE — Discharge Summary (Addendum)
OB Discharge Summary    Patient Name: Maria Bruce DOB: Oct 20, 1990 MRN: [id number] Date of admission: 12/07/2017  Delivering MD: Raelyn Mora   Date of discharge: 12/09/2017  Admitting diagnosis: IOL for postdates Intrauterine pregnancy: [redacted]w[redacted]d    Secondary diagnosis:  Active Problems:   Patient Active Problem List   Diagnosis Date Noted  . Post-dates pregnancy 12/07/2017  . SVD (spontaneous vaginal delivery) 12/07/2017  . Spontaneous vaginal delivery 12/07/2017  . Unwanted fertility 11/17/2017  . Anemia in pregnancy, third trimester 11/10/2017  . No prenatal care in current pregnancy in third trimester   . Encounter for fetal anatomic survey   . [redacted] weeks gestation of pregnancy   . Obesity affecting pregnancy in third trimester   . Supervision of other normal pregnancy, antepartum 11/04/2017  . Late prenatal care affecting pregnancy 11/04/2017   Additional problems: N/A     Discharge diagnosis: Term Pregnancy Delivered                                                                                                Post partum procedures: None  Complications: None  Hospital course:  Induction of Labor With Vaginal Delivery   27 y.o. yo [id number] at [redacted]w[redacted]d was admitted to the hospital 12/07/2017 for induction of labor.  Indication for induction: Postdates.  Patient had an uncomplicated labor course as follows: Membrane Rupture Time/Date: 3:29 PM ,12/07/2017   Intrapartum Procedures: Episiotomy: None [1]                                         Lacerations:  None [1]  Patient had delivery of a Viable infant.  Information for the patient's newborn:  Maria Bruce, Girl [name] [[id number]]  Delivery Method: Vaginal, Spontaneous(Filed from Delivery Summary)   12/07/2017  Details of delivery can be found in separate delivery note.  Patient had a routine postpartum course. Patient is discharged home 12/09/17.  Physical exam  Vitals:   12/08/17 2230 12/09/17 0544  BP: 111/69  110/66  Pulse: 85 81  Resp: 18 18  Temp: 98.3 F (36.8 C) 98.1 F (36.7 C)  SpO2: 99% 100%   General: alert, cooperative and no distress Lochia: appropriate Uterine Fundus: firm Incision: N/A DVT Evaluation: No evidence of DVT seen on physical exam.  Labs: No results found for this or any previous visit (from the past 24 hour(s)).  Discharge instruction: per After Visit Summary and "Baby and Me Booklet".  After visit meds:  No Known Allergies  Allergies as of 12/09/2017   No Known Allergies     Medication List    TAKE these medications   famotidine 20 MG tablet Commonly known as:  PEPCID Take 1 tablet (20 mg total) by mouth 2 (two) times daily.   Ferrous Fumarate-Folic Acid 324-1 MG Tabs Take 1 tablet by mouth 2 (two) times daily.   ibuprofen 600 MG tablet Commonly known as:  ADVIL,MOTRIN Take 1 tablet (600 mg total) by mouth every 6 (six) hours.   prenatal multivitamin  Tabs tablet Take 1 tablet by mouth daily at 12 noon.      Diet: routine diet  Activity: Advance as tolerated. Pelvic rest for 6 weeks.   Outpatient follow up:6 weeks Future Appointments: No future appointments.  Follow up Appt: No follow-ups on file.  Postpartum contraception: Nexplanon  Newborn Data: APGAR (1 MIN): 9   APGAR (5 MINS): 9    Baby Feeding: Bottle Disposition:home with mother  [name], DO 12/09/2017  CNM attestation I have seen and examined this patient and agree with above documentation in the resident's note.   Maria Bruce is a 27 y.o. [phone] s/p SVD.   Pain is well controlled.  Plan for birth control is Nexplanon.  Method of Feeding: bottle  PE:  BP 110/66 (BP Location: Right Arm)   Pulse 81   Temp 98.1 F (36.7 C) (Oral)   Resp 18   Ht 5\' 4"  (1.626 m)   Wt 106.7 kg (235 lb 4.8 oz)   LMP 03/17/2017   SpO2 100%   Breastfeeding? Unknown   BMI 40.39 kg/m  Fundus firm  Recent Labs    12/07/17 0803 12/08/17 0612  HGB 8.1* 7.6*  HCT  26.7* 26.0*     Plan: discharge today - postpartum care discussed - f/u clinic in 6 weeks for postpartum visit   Cam HaiSHAW, Francys, CNM 7:54 AM  12/09/2017

## 2017-12-09 NOTE — Discharge Instructions (Signed)
Vaginal Delivery, Care After °Refer to this sheet in the next few weeks. These instructions provide you with information about caring for yourself after vaginal delivery. Your health care provider may also give you more specific instructions. Your treatment has been planned according to current medical practices, but problems sometimes occur. Call your health care provider if you have any problems or questions. °What can I expect after the procedure? °After vaginal delivery, it is common to have: °· Some bleeding from your vagina. °· Soreness in your abdomen, your vagina, and the area of skin between your vaginal opening and your anus (perineum). °· Pelvic cramps. °· Fatigue. ° °Follow these instructions at home: °Medicines °· Take over-the-counter and prescription medicines only as told by your health care provider. °· If you were prescribed an antibiotic medicine, take it as told by your health care provider. Do not stop taking the antibiotic until it is finished. °Driving ° °· Do not drive or operate heavy machinery while taking prescription pain medicine. °· Do not drive for 24 hours if you received a sedative. °Lifestyle °· Do not drink alcohol. This is especially important if you are breastfeeding or taking medicine to relieve pain. °· Do not use tobacco products, including cigarettes, chewing tobacco, or e-cigarettes. If you need help quitting, ask your health care provider. °Eating and drinking °· Drink at least 8 eight-ounce glasses of water every day unless you are told not to by your health care provider. If you choose to breastfeed your baby, you may need to drink more water than this. °· Eat high-fiber foods every day. These foods may help prevent or relieve constipation. High-fiber foods include: °? Whole grain cereals and breads. °? Brown rice. °? Beans. °? Fresh fruits and vegetables. °Activity °· Return to your normal activities as told by your health care provider. Ask your health care provider  what activities are safe for you. °· Rest as much as possible. Try to rest or take a nap when your baby is sleeping. °· Do not lift anything that is heavier than your baby or 10 lb (4.5 kg) until your health care provider says that it is safe. °· Talk with your health care provider about when you can engage in sexual activity. This may depend on your: °? Risk of infection. °? Rate of healing. °? Comfort and desire to engage in sexual activity. °Vaginal Care °· If you have an episiotomy or a vaginal tear, check the area every day for signs of infection. Check for: °? More redness, swelling, or pain. °? More fluid or blood. °? Warmth. °? Pus or a bad smell. °· Do not use tampons or douches until your health care provider says this is safe. °· Watch for any blood clots that may pass from your vagina. These may look like clumps of dark red, brown, or black discharge. °General instructions °· Keep your perineum clean and dry as told by your health care provider. °· Wear loose, comfortable clothing. °· Wipe from front to back when you use the toilet. °· Ask your health care provider if you can shower or take a bath. If you had an episiotomy or a perineal tear during labor and delivery, your health care provider may tell you not to take baths for a certain length of time. °· Wear a bra that supports your breasts and fits you well. °· If possible, have someone help you with household activities and help care for your baby for at least a few days after   you leave the hospital. °· Keep all follow-up visits for you and your baby as told by your health care provider. This is important. °Contact a health care provider if: °· You have: °? Vaginal discharge that has a bad smell. °? Difficulty urinating. °? Pain when urinating. °? A sudden increase or decrease in the frequency of your bowel movements. °? More redness, swelling, or pain around your episiotomy or vaginal tear. °? More fluid or blood coming from your episiotomy or  vaginal tear. °? Pus or a bad smell coming from your episiotomy or vaginal tear. °? A fever. °? A rash. °? Little or no interest in activities you used to enjoy. °? Questions about caring for yourself or your baby. °· Your episiotomy or vaginal tear feels warm to the touch. °· Your episiotomy or vaginal tear is separating or does not appear to be healing. °· Your breasts are painful, hard, or turn red. °· You feel unusually sad or worried. °· You feel nauseous or you vomit. °· You pass large blood clots from your vagina. If you pass a blood clot from your vagina, save it to show to your health care provider. Do not flush blood clots down the toilet without having your health care provider look at them. °· You urinate more than usual. °· You are dizzy or light-headed. °· You have not breastfed at all and you have not had a menstrual period for 12 weeks after delivery. °· You have stopped breastfeeding and you have not had a menstrual period for 12 weeks after you stopped breastfeeding. °Get help right away if: °· You have: °? Pain that does not go away or does not get better with medicine. °? Chest pain. °? Difficulty breathing. °? Blurred vision or spots in your vision. °? Thoughts about hurting yourself or your baby. °· You develop pain in your abdomen or in one of your legs. °· You develop a severe headache. °· You faint. °· You bleed from your vagina so much that you fill two sanitary pads in one hour. °This information is not intended to replace advice given to you by your health care provider. Make sure you discuss any questions you have with your health care provider. °Document Released: 05/22/2000 Document Revised: 11/06/2015 Document Reviewed: 06/09/2015 °Elsevier Interactive Patient Education © 2018 Elsevier Inc. ° °

## 2017-12-20 ENCOUNTER — Institutional Professional Consult (permissible substitution): Payer: Self-pay

## 2017-12-20 NOTE — BH Specialist Note (Deleted)
Integrated Behavioral Health Initial Visit  MRN: [id number] Name: Maria Bruce  Number of Integrated Behavioral Health Clinician visits:: {IBH Number of Visits:21014052} Session Start time: ***  Session End time: *** Total time: {IBH Total Time:21014050}  Type of Service: Integrated Behavioral Health- Individual/Family Interpretor:{yes XB:[id number]}no:[id number]} Interpretor Name and Language: ***   Warm Hand Off Completed.       SUBJECTIVE: Maria Bruce is a 27 y.o. female accompanied by {CHL AMB ACCOMPANIED GM:[id number]}BY:[id number]} Patient was referred by *** for ***. Patient reports the following symptoms/concerns: *** Duration of problem: ***; Severity of problem: {Mild/Moderate/Severe:20260}  OBJECTIVE: Mood: {BHH MOOD:22306} and Affect: {BHH AFFECT:22307} Risk of harm to self or others: {CHL AMB BH Suicide Current Mental Status:21022748}  LIFE CONTEXT: Family and Social: *** School/Work: *** Self-Care: *** Life Changes: ***  GOALS ADDRESSED: Patient will: 1. Reduce symptoms of: {IBH Symptoms:21014056} 2. Increase knowledge and/or ability of: {IBH Patient Tools:21014057}  3. Demonstrate ability to: {IBH Goals:21014053}  INTERVENTIONS: Interventions utilized: {IBH Interventions:21014054}  Standardized Assessments completed: {IBH Screening Tools:21014051}  ASSESSMENT: Patient currently experiencing ***.   Patient may benefit from ***.  PLAN: 1. Follow up with behavioral health clinician on : *** 2. Behavioral recommendations: *** 3. Referral(s): {IBH Referrals:21014055} 4. "From scale of 1-10, how likely are you to follow plan?": ***  Rae LipsJamie C Kessa Fairbairn, LCSW    [facility] 11/26/2017 11/17/2017 11/10/2017 11/04/2017 09/17/2016  Decreased Interest 1 0 0 1 0  Down, Depressed, Hopeless 0 0 0 0 0  PHQ - 2 Score 1 0 0 1 0  Altered sleeping 1 0 0 0 0  Tired, decreased energy 0 1 1 1  0  Change in appetite 0 0 0 0 1  Feeling bad or failure about yourself   0 0 0 0 0  Trouble concentrating 0 0 0 0 0  Moving slowly or fidgety/restless 0 0 0 0 0  Suicidal thoughts 0 0 0 0 0  PHQ-9 Score 2 1 1 2 1    GAD 7 : Generalized Anxiety Score 11/26/2017 11/17/2017 11/10/2017 11/04/2017  Nervous, Anxious, on Edge 0 0 0 0  Control/stop worrying 0 0 0 0  Worry too much - different things 0 0 0 1  Trouble relaxing 0 0 0 0  Restless 0 0 0 0  Easily annoyed or irritable 0 0 0 0  Afraid - awful might happen 0 0 0 0  Total GAD 7 Score 0 0 0 1

## 2018-01-10 ENCOUNTER — Ambulatory Visit (INDEPENDENT_AMBULATORY_CARE_PROVIDER_SITE_OTHER): Payer: Medicaid Other | Admitting: Internal Medicine

## 2018-01-10 ENCOUNTER — Encounter: Payer: Self-pay | Admitting: Internal Medicine

## 2018-01-10 DIAGNOSIS — Z30013 Encounter for initial prescription of injectable contraceptive: Secondary | ICD-10-CM

## 2018-01-10 DIAGNOSIS — Z3042 Encounter for surveillance of injectable contraceptive: Secondary | ICD-10-CM

## 2018-01-10 MED ORDER — MEDROXYPROGESTERONE ACETATE 150 MG/ML IM SUSP
150.0000 mg | Freq: Once | INTRAMUSCULAR | Status: AC
  Administered 2018-01-10: 150 mg via INTRAMUSCULAR

## 2018-01-10 NOTE — Patient Instructions (Signed)
Your DepoProvera injection will provide you with protection against pregnancy for the next 3 months. At that point, you will need to have another injection or switch to a different method of birth control. The most common side effect of Depo is irregular spotting.   The Nexplanon is an implant that goes into your arm and can remain in place for 3 years as a contraceptive method. It is in an office procedure for insertion and removal. Insertion takes less than 10 minutes from the time we are setting up the equipment to the time that the bandage is applied to your arm. The numbing medicine used is usually the most uncomfortable part of the procedure.   A single-rod progestin implant, Implanon or Nexplanon, is available in the [place] and elsewhere. A health care provider inserts a small device under the skin in the upper inner arm. It is highly effective for at least three years, and can be removed sooner if you want to become pregnant or are unhappy with this method. The pregnancy rate is less than 1 percent in the first year of use.The implant protects you from pregnancy within seven days of insertion. If the implant is inserted more than five days from the start of your period, backup birth control (such as condoms) should be used for seven days. Irregular bleeding is the most bothersome side effect. Your ability to become pregnant returns quickly after the implant is removed.

## 2018-01-10 NOTE — Progress Notes (Signed)
Subjective:     Maria Bruce is a 27 y.o. female who presents for a postpartum visit. She is 4 weeks 6 days postpartum following a spontaneous vaginal delivery. I have fully reviewed the prenatal and intrapartum course. The delivery was at [redacted]wks gestational weeks. Outcome: spontaneous vaginal delivery. Anesthesia: epidural. Postpartum course has been uncomplicated. Baby's course has been uncomplicated. Baby is feeding by bottle - Lucien MonsGerber Good Start - Gentle. Bleeding no bleeding. Bowel function is normal. Bladder function is normal. Patient is not sexually active. Denies pelvic pain. Contraception method is undecided. Postpartum depression screening: negative.  Patient reports that she has some concerns about the Nexplanon. She is worried that it might get lost in her arm or provide repetitive pain.   The following portions of the patient's history were reviewed and updated as appropriate: allergies, current medications, past family history, past medical history, past social history, past surgical history and problem list.  Review of Systems Pertinent items are noted in HPI.   Objective:    BP 105/83   Pulse 71   Ht 5\' 4"  (1.626 m)   Wt 212 lb (96.2 kg)   Breastfeeding? No Comment: Lucien Mons[employer] Good Start-Gentile  BMI 36.39 kg/m   General:  alert and cooperative  Lungs: clear to auscultation bilaterally  Heart:  regular rate and rhythm, S1, S2 normal, no murmur, click, rub or gallop  Abdomen: soft, non-tender; bowel sounds normal; no masses,  no organomegaly, fundus of uterus appropriate for postpartum         Assessment:     Benign postpartum exam. Pap smear not done at today's visit. Negative PAP in Jan 2018.   Plan:   Depo-Provera given today. Discussed Nexplanon at length with patient and gave handout information. Patient to return in 3 months for subsequent DepoProvera or to change method of contraception. Annual exam in one year.   Marcy Sirenatherine Otis Burress, D.O. OB Fellow   01/10/2018, 5:58 PM

## 2019-01-01 IMAGING — US US FETAL BPP W/ NON-STRESS
1 series · 11 of 11 positions shown · non-contrast
Comparison: none

[Series 1: us fetal bpp w/nonstress · 11 acquisitions, 11 frames shown]
[im 1/11]
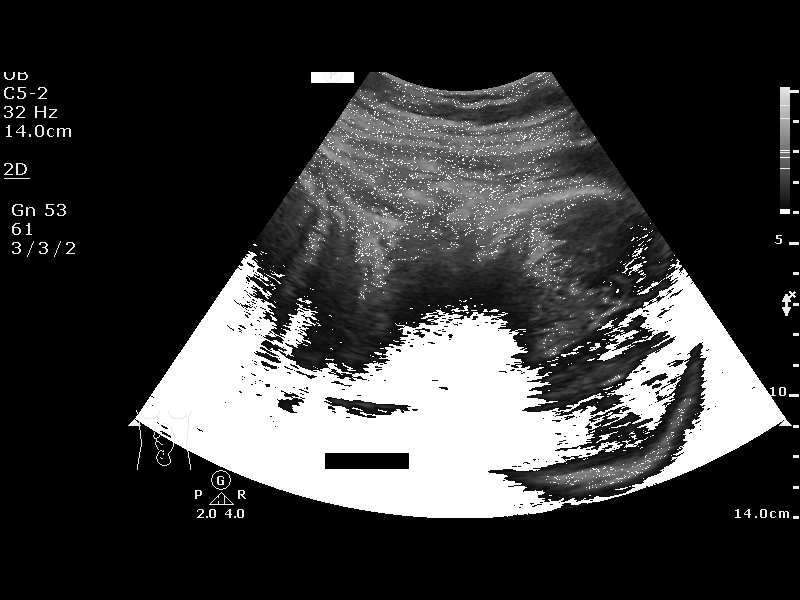
[im 2/11]
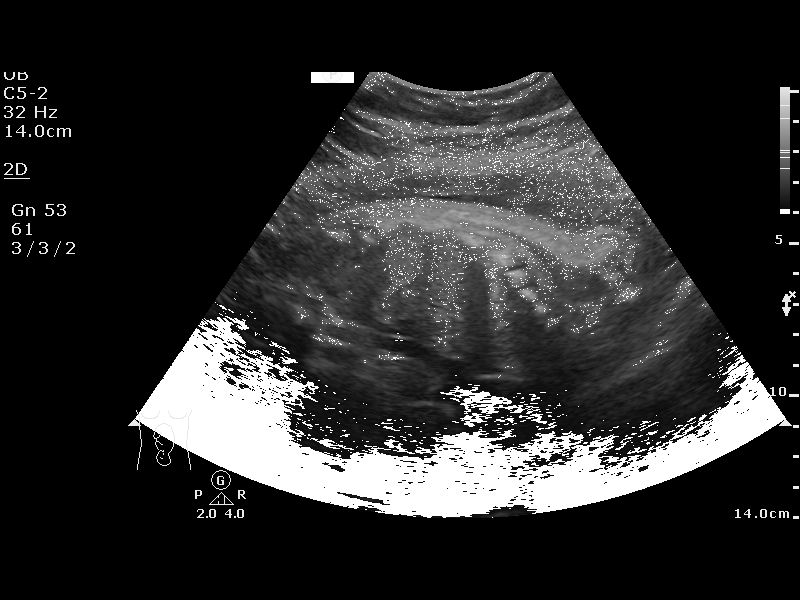
[im 3/11]
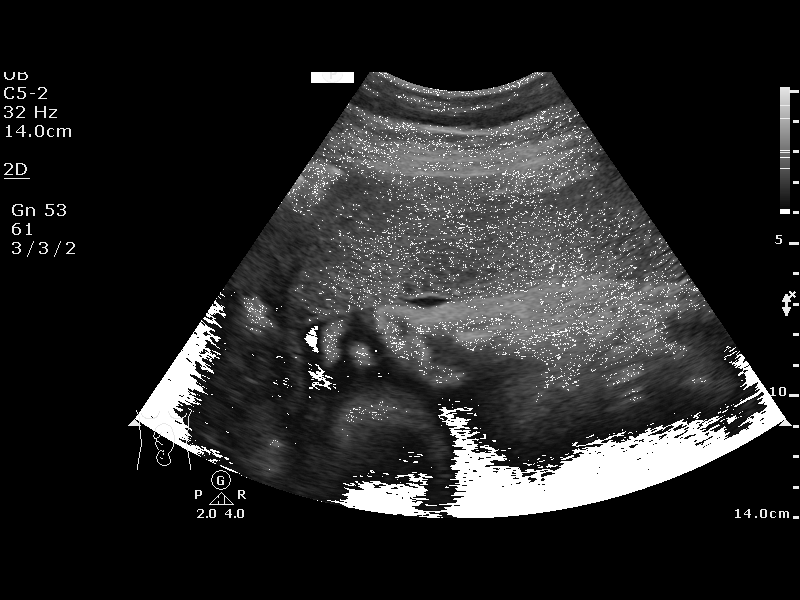
[im 4/11]
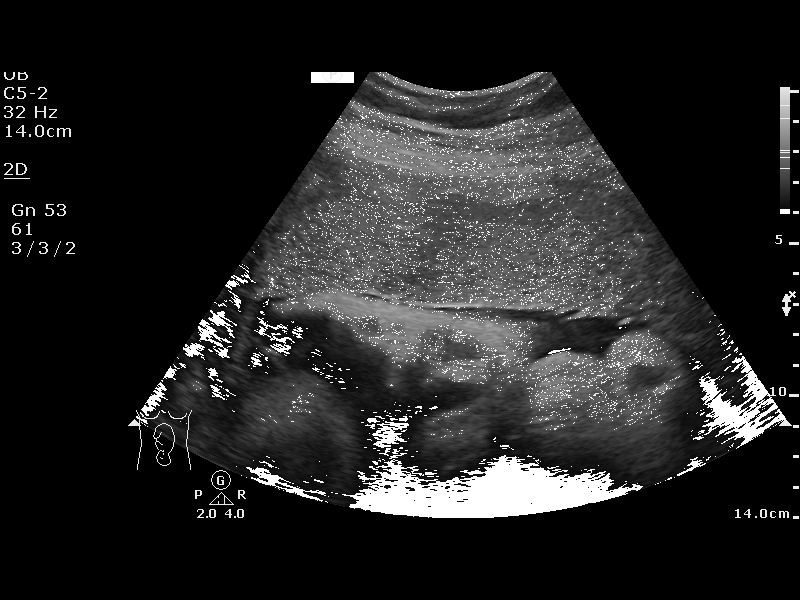
[im 5/11]
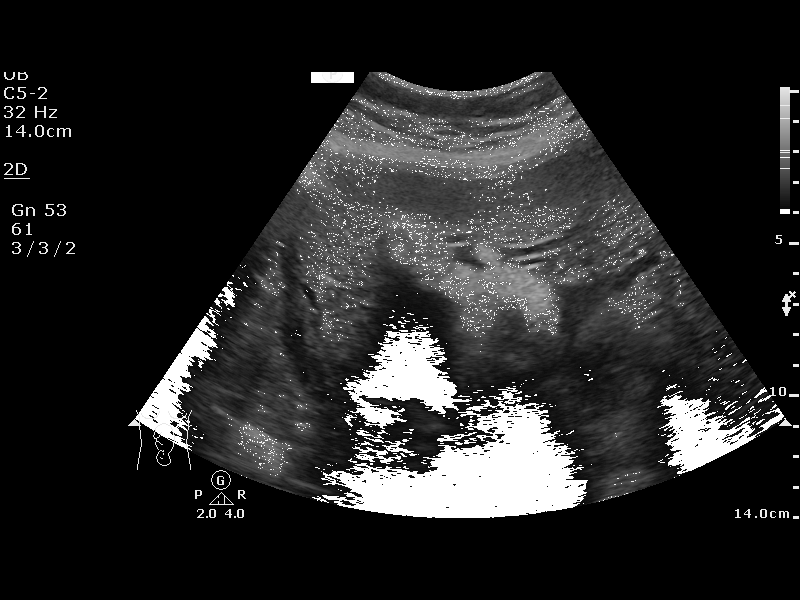
[im 6/11]
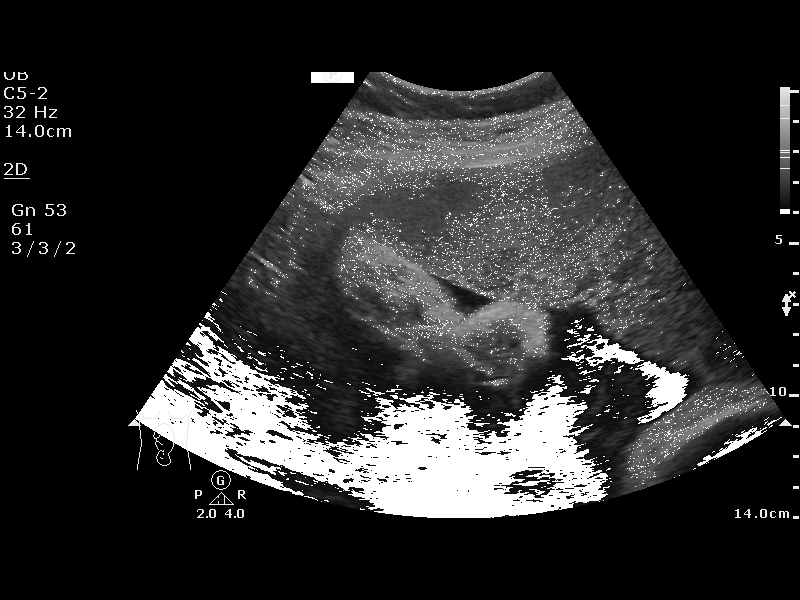
[im 7/11]
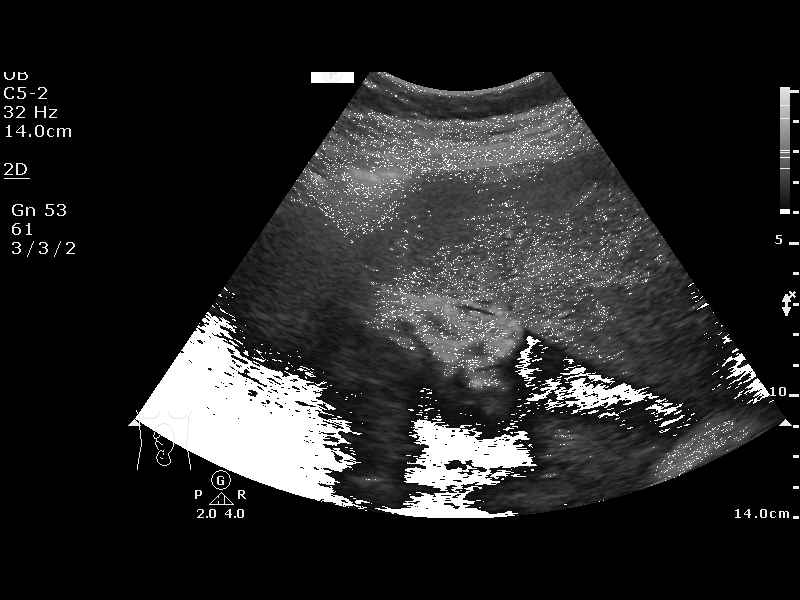
[im 8/11]
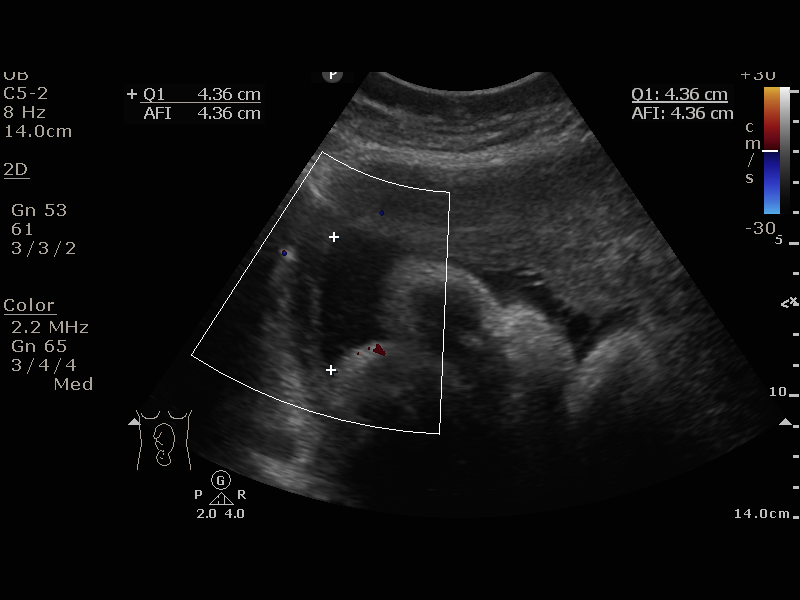
[im 9/11]
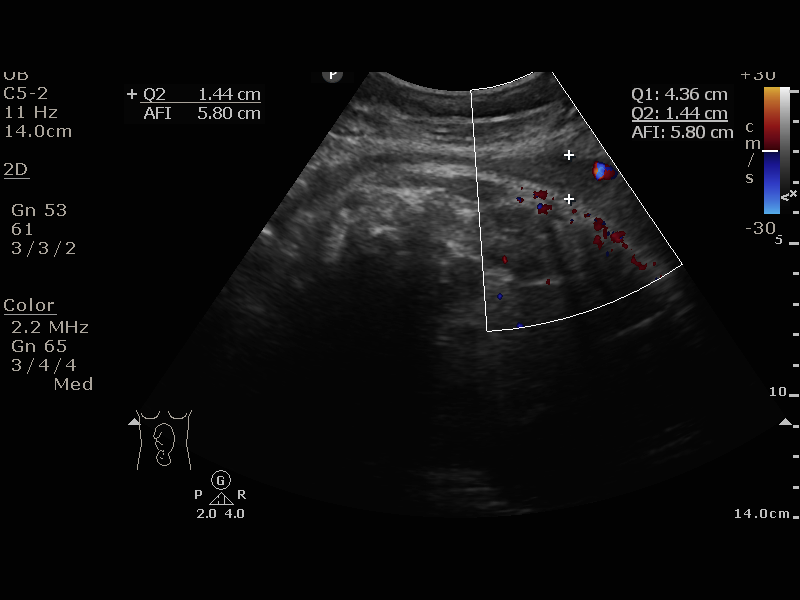
[im 10/11]
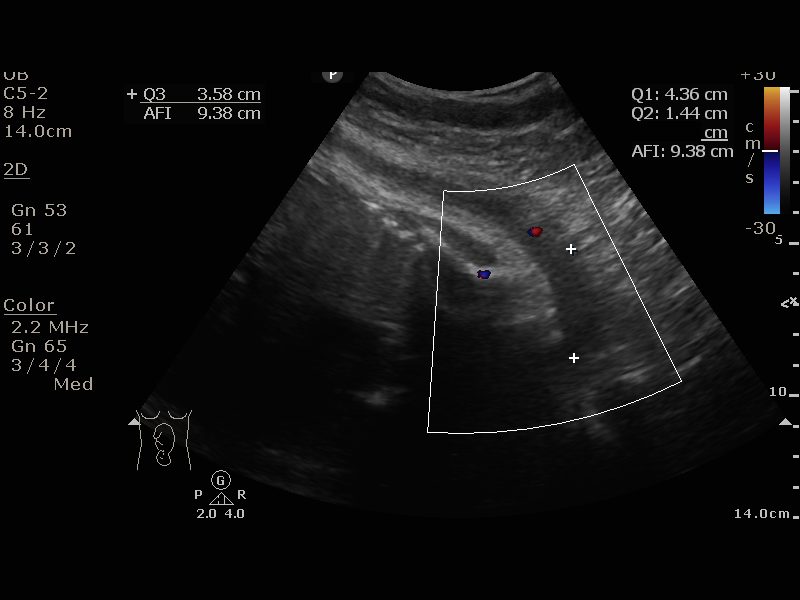
[im 11/11]
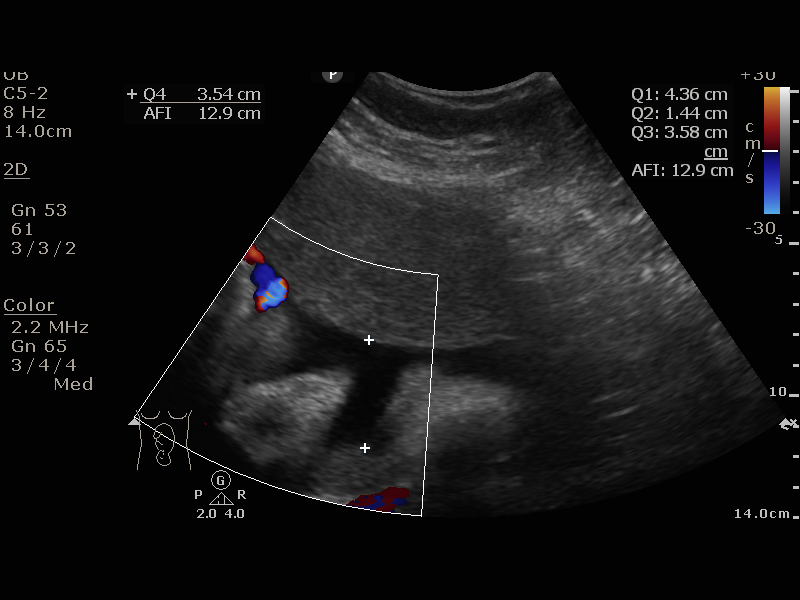

[11 of 11 positions shown; findings below may reference images not displayed]

MAKSIMILIAN

BSN
[facility] [HOSPITAL]
Attending:        Myrna Iris Febo         Secondary Phy.:   [facility]/Triage
[facility]                             [facility]
[REDACTED]

1  US FETAL BPP W/NONSTRESS                    76818.4

Indications

Postdate pregnancy (40-42 weeks)
40 weeks gestation of pregnancy
OB History

Gravidity:    6         Term:   5
Living:       5
Fetal Evaluation

Num Of Fetuses:     1
Preg. Location:     Intrauterine
Cardiac Activity:   Observed
Fetal Lie:          Maternal left side
Presentation:       Cephalic

Amniotic Fluid
AFI FV:      Subjectively within normal limits

AFI Sum(cm)     %Tile       Largest Pocket(cm)
12.92           54

RUQ(cm)       RLQ(cm)       LUQ(cm)        LLQ(cm)
4.36

Comment:    Breathing noted intermittently, but not sustained.
Biophysical Evaluation
Amniotic F.V:   Pocket => 2 cm two         F. Tone:        Observed
planes
F. Movement:    Observed                   N.S.T:          Reactive
F. Breathing:   Not Observed               Score:          [DATE]
Gestational Age

Best:          40w 1d     Det. By:  U/S  (10/31/17)          EDD:   11/30/17
Impression

Postterm pregnancy with IUP at 12w5d
Normal amniotic fluid volume
Reassuring BPP of [DATE]
Recommendations

Delivery indicated by 41 weeks

## 2019-01-10 ENCOUNTER — Encounter: Payer: Self-pay | Admitting: Student

## 2019-01-10 ENCOUNTER — Other Ambulatory Visit: Payer: Self-pay

## 2019-01-10 ENCOUNTER — Ambulatory Visit (INDEPENDENT_AMBULATORY_CARE_PROVIDER_SITE_OTHER): Payer: Medicaid Other | Admitting: Student

## 2019-01-10 VITALS — BP 115/79 | HR 92 | Ht 64.0 in | Wt 194.0 lb

## 2019-01-10 DIAGNOSIS — Z3042 Encounter for surveillance of injectable contraceptive: Secondary | ICD-10-CM

## 2019-01-10 MED ORDER — MEDROXYPROGESTERONE ACETATE 150 MG/ML IM SUSP
150.0000 mg | Freq: Once | INTRAMUSCULAR | Status: AC
  Administered 2019-01-10: 150 mg via INTRAMUSCULAR

## 2019-01-10 NOTE — Patient Instructions (Signed)
Medroxyprogesterone injection [Contraceptive] What is this medicine? MEDROXYPROGESTERONE (me DROX ee proe JES te rone) contraceptive injections prevent pregnancy. They provide effective birth control for 3 months. Depo-subQ Provera 104 is also used for treating pain related to endometriosis. This medicine may be used for other purposes; ask your health care provider or pharmacist if you have questions. COMMON BRAND NAME(S): Depo-Provera, Depo-subQ Provera 104 What should I tell my health care provider before I take this medicine? They need to know if you have any of these conditions:  frequently drink alcohol  asthma  blood vessel disease or a history of a blood clot in the lungs or legs  bone disease such as osteoporosis  breast cancer  diabetes  eating disorder (anorexia nervosa or bulimia)  high blood pressure  HIV infection or AIDS  kidney disease  liver disease  mental depression  migraine  seizures (convulsions)  stroke  tobacco smoker  vaginal bleeding  an unusual or allergic reaction to medroxyprogesterone, other hormones, medicines, foods, dyes, or preservatives  pregnant or trying to get pregnant  breast-feeding How should I use this medicine? Depo-Provera Contraceptive injection is given into a muscle. Depo-subQ Provera 104 injection is given under the skin. These injections are given by a health care professional. You must not be pregnant before getting an injection. The injection is usually given during the first 5 days after the start of a menstrual period or 6 weeks after delivery of a baby. Talk to your pediatrician regarding the use of this medicine in children. Special care may be needed. These injections have been used in female children who have started having menstrual periods. Overdosage: If you think you have taken too much of this medicine contact a poison control center or emergency room at once. NOTE: This medicine is only for you. Do not  share this medicine with others. What if I miss a dose? Try not to miss a dose. You must get an injection once every 3 months to maintain birth control. If you cannot keep an appointment, call and reschedule it. If you wait longer than 13 weeks between Depo-Provera contraceptive injections or longer than 14 weeks between Depo-subQ Provera 104 injections, you could get pregnant. Use another method for birth control if you miss your appointment. You may also need a pregnancy test before receiving another injection. What may interact with this medicine? Do not take this medicine with any of the following medications:  bosentan This medicine may also interact with the following medications:  aminoglutethimide  antibiotics or medicines for infections, especially rifampin, rifabutin, rifapentine, and griseofulvin  aprepitant  barbiturate medicines such as phenobarbital or primidone  bexarotene  carbamazepine  medicines for seizures like ethotoin, felbamate, oxcarbazepine, phenytoin, topiramate  modafinil  St. John's wort This list may not describe all possible interactions. Give your health care provider a list of all the medicines, herbs, non-prescription drugs, or dietary supplements you use. Also tell them if you smoke, drink alcohol, or use illegal drugs. Some items may interact with your medicine. What should I watch for while using this medicine? This drug does not protect you against HIV infection (AIDS) or other sexually transmitted diseases. Use of this product may cause you to lose calcium from your bones. Loss of calcium may cause weak bones (osteoporosis). Only use this product for more than 2 years if other forms of birth control are not right for you. The longer you use this product for birth control the more likely you will be at risk   for weak bones. Ask your health care professional how you can keep strong bones. You may have a change in bleeding pattern or irregular periods.  Many females stop having periods while taking this drug. If you have received your injections on time, your chance of being pregnant is very low. If you think you may be pregnant, see your health care professional as soon as possible. Tell your health care professional if you want to get pregnant within the next year. The effect of this medicine may last a long time after you get your last injection. What side effects may I notice from receiving this medicine? Side effects that you should report to your doctor or health care professional as soon as possible:  allergic reactions like skin rash, itching or hives, swelling of the face, lips, or tongue  breast tenderness or discharge  breathing problems  changes in vision  depression  feeling faint or lightheaded, falls  fever  pain in the abdomen, chest, groin, or leg  problems with balance, talking, walking  unusually weak or tired  yellowing of the eyes or skin Side effects that usually do not require medical attention (report to your doctor or health care professional if they continue or are bothersome):  acne  fluid retention and swelling  headache  irregular periods, spotting, or absent periods  temporary pain, itching, or skin reaction at site where injected  weight gain This list may not describe all possible side effects. Call your doctor for medical advice about side effects. You may report side effects to FDA at 1-800-FDA-1088. Where should I keep my medicine? This does not apply. The injection will be given to you by a health care professional. NOTE: This sheet is a summary. It may not cover all possible information. If you have questions about this medicine, talk to your doctor, pharmacist, or health care provider.  2020 Elsevier/Gold Standard (2008-06-15 18:37:56)  

## 2019-01-11 LAB — POCT PREGNANCY, URINE: Preg Test, Ur: NEGATIVE

## 2019-01-11 NOTE — Progress Notes (Signed)
History:  Ms. Maria Bruce is a 28 y.o. [id number] who presents to clinic today for Depo provera shot.  She does not want a LARC method.  Patient had normal pap in 2018. Patient denies abnormal discharge, dysuria, exposure to STI or other ob-gyn complaints.   The following portions of the patient's history were reviewed and updated as appropriate: allergies, current medications, family history, past medical history, social history, past surgical history and problem list.  Review of Systems:  Review of Systems  Constitutional: Negative.   HENT: Negative.   Gastrointestinal: Negative.   Genitourinary: Negative.   Musculoskeletal: Negative.   Skin: Negative.   Neurological: Negative.   Psychiatric/Behavioral: Negative.       Objective:  Physical Exam BP 115/79   Pulse 92   Ht 5\' 4"  (1.626 m)   Wt 194 lb (88 kg)   LMP 12/20/2018   BMI 33.30 kg/m  Physical Exam  Constitutional: She appears well-developed and well-nourished.  HENT:  Head: Normocephalic.  Neck: Normal range of motion.  Cardiovascular: Normal rate and regular rhythm.  Respiratory: Effort normal.  GI: Soft.  Normal breast exam: no lumps, masses or tenderness.  Bimanual deferred    Labs and Imaging Results for orders placed or performed in visit on 01/10/19 (from the past 24 hour(s))  Pregnancy, urine POC     Status: None   Collection Time: 01/10/19  4:49 PM  Result Value Ref Range   Preg Test, Ur NEGATIVE NEGATIVE    No results found.   Assessment & Plan:  1. Encounter for surveillance of injectable contraceptive -UPT negative -Encouraged patient to think about LARC method -Return in 3 months for follow up Depo.  - medroxyPROGESTERone (DEPO-PROVERA) injection 150 mg   [place], [place] 01/11/2019 1:01 PM

## 2019-03-30 ENCOUNTER — Ambulatory Visit: Payer: Medicaid Other

## 2019-04-03 ENCOUNTER — Ambulatory Visit: Payer: Medicaid Other

## 2019-04-11 ENCOUNTER — Ambulatory Visit: Payer: Medicaid Other

## 2019-04-12 ENCOUNTER — Ambulatory Visit (INDEPENDENT_AMBULATORY_CARE_PROVIDER_SITE_OTHER): Payer: Medicaid Other | Admitting: Emergency Medicine

## 2019-04-12 ENCOUNTER — Other Ambulatory Visit: Payer: Self-pay

## 2019-04-12 DIAGNOSIS — Z3042 Encounter for surveillance of injectable contraceptive: Secondary | ICD-10-CM | POA: Diagnosis not present

## 2019-04-12 DIAGNOSIS — Z3202 Encounter for pregnancy test, result negative: Secondary | ICD-10-CM | POA: Diagnosis not present

## 2019-04-12 LAB — POCT PREGNANCY, URINE: Preg Test, Ur: NEGATIVE

## 2019-04-12 MED ORDER — MEDROXYPROGESTERONE ACETATE 150 MG/ML IM SUSP
150.0000 mg | Freq: Once | INTRAMUSCULAR | Status: AC
  Administered 2019-04-12: 150 mg via INTRAMUSCULAR

## 2019-04-12 NOTE — Progress Notes (Signed)
[facility] here for Depo-Provera  Injection.  Injection administered without complication. Patient will return in 3 months for next injection.  Loma Sousa, [place] 04/12/2019   [phone]

## 2019-04-13 NOTE — Progress Notes (Signed)
Patient seen and assessed by nursing staff during this encounter. I have reviewed the chart and agree with the documentation and plan.  Shirly Bartosiewicz, MD 04/13/2019 10:19 AM    

## 2019-04-17 ENCOUNTER — Ambulatory Visit: Payer: Medicaid Other

## 2019-06-28 ENCOUNTER — Other Ambulatory Visit: Payer: Self-pay

## 2019-06-28 ENCOUNTER — Ambulatory Visit (INDEPENDENT_AMBULATORY_CARE_PROVIDER_SITE_OTHER): Payer: Medicaid Other | Admitting: Lactation Services

## 2019-06-28 ENCOUNTER — Encounter: Payer: Self-pay | Admitting: Medical

## 2019-06-28 DIAGNOSIS — Z3042 Encounter for surveillance of injectable contraceptive: Secondary | ICD-10-CM

## 2019-06-28 MED ORDER — MEDROXYPROGESTERONE ACETATE 150 MG/ML IM SUSP
150.0000 mg | Freq: Once | INTRAMUSCULAR | Status: AC
  Administered 2019-06-28: 10:00:00 150 mg via INTRAMUSCULAR

## 2019-06-28 NOTE — Progress Notes (Signed)
Patient seen and assessed by nursing staff during this encounter. I have reviewed the chart and agree with the documentation and plan.  Jaynie Collins, MD 06/28/2019 10:17 AM

## 2019-06-28 NOTE — Progress Notes (Signed)
[facility] here for Depo-Provera  Injection.  Injection administered without complication. Patient will return in 3 months for next injection. Pt tolerated injection well.   Ed Blalock, RN 06/28/2019  10:10 AM

## 2019-09-18 ENCOUNTER — Other Ambulatory Visit: Payer: Self-pay

## 2019-09-18 ENCOUNTER — Ambulatory Visit (INDEPENDENT_AMBULATORY_CARE_PROVIDER_SITE_OTHER): Payer: Medicaid Other | Admitting: General Practice

## 2019-09-18 ENCOUNTER — Encounter: Payer: Self-pay | Admitting: Family Medicine

## 2019-09-18 VITALS — BP 103/87 | HR 88 | Ht 64.0 in | Wt 226.0 lb

## 2019-09-18 DIAGNOSIS — Z3042 Encounter for surveillance of injectable contraceptive: Secondary | ICD-10-CM | POA: Diagnosis not present

## 2019-09-18 MED ORDER — MEDROXYPROGESTERONE ACETATE 150 MG/ML IM SUSP
150.0000 mg | Freq: Once | INTRAMUSCULAR | Status: AC
  Administered 2019-09-18: 150 mg via INTRAMUSCULAR

## 2019-09-18 NOTE — Progress Notes (Signed)
[facility] here for Depo-Provera  Injection.  Injection administered without complication. Patient will return in 3 months for next injection.  Marylynn Pearson, RN 09/18/2019  8:45 AM

## 2019-09-18 NOTE — Progress Notes (Signed)
Patient seen and assessed by nursing staff during this encounter. I have reviewed the chart and agree with the documentation and plan. I have also made any necessary editorial changes.  Ravenna Legore, MD 09/18/2019 10:21 AM   

## 2019-11-02 ENCOUNTER — Encounter: Payer: Self-pay | Admitting: Internal Medicine

## 2019-11-02 ENCOUNTER — Other Ambulatory Visit: Payer: Self-pay

## 2019-11-02 ENCOUNTER — Ambulatory Visit: Payer: Medicaid Other | Admitting: Internal Medicine

## 2019-11-02 VITALS — BP 109/63 | HR 94 | Temp 98.6°F | Ht 64.0 in | Wt 235.6 lb

## 2019-11-02 DIAGNOSIS — J301 Allergic rhinitis due to pollen: Secondary | ICD-10-CM | POA: Diagnosis not present

## 2019-11-02 DIAGNOSIS — O99013 Anemia complicating pregnancy, third trimester: Secondary | ICD-10-CM

## 2019-11-02 DIAGNOSIS — Z6841 Body Mass Index (BMI) 40.0 and over, adult: Secondary | ICD-10-CM

## 2019-11-02 DIAGNOSIS — Z862 Personal history of diseases of the blood and blood-forming organs and certain disorders involving the immune mechanism: Secondary | ICD-10-CM | POA: Diagnosis not present

## 2019-11-02 MED ORDER — FLUTICASONE PROPIONATE 50 MCG/ACT NA SUSP: 1.0000 | Freq: Every day | NASAL | 2 refills | Status: DC

## 2019-11-02 MED ORDER — LEVOCETIRIZINE DIHYDROCHLORIDE 5 MG PO TABS: 5.0000 mg | ORAL_TABLET | Freq: Every evening | ORAL | 1 refills | Status: DC

## 2019-11-02 NOTE — Patient Instructions (Signed)
Thank you for allowing [place] to provide your care today. Today we discussed your allergies    I have ordered cbc, ferritin, bmp, lipid panel labs for you. I will call if any are abnormal.    Today we made the following changes to your medications.    Please take your allergy medications as prescribed and use Flonase as needed  Please follow-up in 3 months.    Should you have any questions or concerns please call the internal medicine clinic at [phone].    Allergic Rhinitis, Adult Allergic rhinitis is an allergic reaction that affects the mucous membrane inside the nose. It causes sneezing, a runny or stuffy nose, and the feeling of mucus going down the back of the throat (postnasal drip). Allergic rhinitis can be mild to severe. There are two types of allergic rhinitis:  Seasonal. This type is also called hay fever. It happens only during certain seasons.  Perennial. This type can happen at any time of the year. What are the causes? This condition happens when the body's defense system (immune system) responds to certain harmless substances called allergens as though they were germs.  Seasonal allergic rhinitis is triggered by pollen, which can come from grasses, trees, and weeds. Perennial allergic rhinitis may be caused by:  House dust mites.  Pet dander.  Mold spores. What are the signs or symptoms? Symptoms of this condition include:  Sneezing.  Runny or stuffy nose (nasal congestion).  Postnasal drip.  Itchy nose.  Tearing of the eyes.  Trouble sleeping.  Daytime sleepiness. How is this diagnosed? This condition may be diagnosed based on:  Your medical history.  A physical exam.  Tests to check for related conditions, such as: ? Asthma. ? Pink eye. ? Ear infection. ? Upper respiratory infection.  Tests to find out which allergens trigger your symptoms. These may include skin or blood tests. How is this treated? There is no cure for this condition, but  treatment can help control symptoms. Treatment may include:  Taking medicines that block allergy symptoms, such as antihistamines. Medicine may be given as a shot, nasal spray, or pill.  Avoiding the allergen.  Desensitization. This treatment involves getting ongoing shots until your body becomes less sensitive to the allergen. This treatment may be done if other treatments do not help.  If taking medicine and avoiding the allergen does not work, new, stronger medicines may be prescribed. Follow these instructions at home:  Find out what you are allergic to. Common allergens include smoke, dust, and pollen.  Avoid the things you are allergic to. These are some things you can do to help avoid allergens: ? Replace carpet with wood, tile, or vinyl flooring. Carpet can trap dander and dust. ? Do not smoke. Do not allow smoking in your home. ? Change your heating and air conditioning filter at least once a month. ? During allergy season:  Keep windows closed as much as possible.  Plan outdoor activities when pollen counts are lowest. This is usually during the evening hours.  When coming indoors, change clothing and shower before sitting on furniture or bedding.  Take over-the-counter and prescription medicines only as told by your health care provider.  Keep all follow-up visits as told by your health care provider. This is important. Contact a health care provider if:  You have a fever.  You develop a persistent cough.  You make whistling sounds when you breathe (you wheeze).  Your symptoms interfere with your normal daily activities. Get  help right away if:  You have shortness of breath. Summary  This condition can be managed by taking medicines as directed and avoiding allergens.  Contact your health care provider if you develop a persistent cough or fever.  During allergy season, keep windows closed as much as possible. This information is not intended to replace advice  given to you by your health care provider. Make sure you discuss any questions you have with your health care provider. Document Revised: 05/07/2017 Document Reviewed: 07/02/2016 Elsevier Patient Education  2020 [employer].

## 2019-11-02 NOTE — Progress Notes (Signed)
CC: Allergic rhinitis  HPI: [name] is a 29 y.o. with PMH listed below presenting with complaint of allergic rhinitis. Please see problem based assessment and plan for further details.  Past Medical History:  Diagnosis Date  . Asthma    as a child   Family History Mother passed from early heart disease in her 72s. Multiple members with diabetes, hypertension  Social History Currently at home taking care of her 46 year old child. Recently moved to area with lot of trees. Denies any alcohol, tobacco, illicit substance use  Review of Systems: Review of Systems  Constitutional: Negative for chills, fever and malaise/fatigue.  HENT: Positive for congestion.   Eyes: Negative for blurred vision.  Respiratory: Negative for cough and shortness of breath.   Cardiovascular: Negative for chest pain, palpitations and leg swelling.  Gastrointestinal: Negative for constipation, diarrhea, nausea and vomiting.  All other systems reviewed and are negative.    Physical Exam: Vitals:   11/02/19 0904  BP: 109/63  Pulse: 94  Temp: 98.6 F (37 C)  TempSrc: Oral  SpO2: 100%  Weight: 235 lb 9.6 oz (106.9 kg)  Height: 5\' 4"  (1.626 m)    Physical Exam  Constitutional: She is oriented to person, place, and time. She appears well-developed and well-nourished. No distress.  HENT:  Mouth/Throat: Oropharynx is clear and moist. No oropharyngeal exudate.  Pale nasal turbinates, no nasal polyps or purulent drainage  Eyes: Conjunctivae are normal.  Cardiovascular: Normal rate, regular rhythm, normal heart sounds and intact distal pulses.  No murmur heard. Respiratory: Effort normal and breath sounds normal. She has no wheezes. She has no rales.  GI: Soft. Bowel sounds are normal. She exhibits no distension. There is no abdominal tenderness.  Musculoskeletal:        General: No edema. Normal range of motion.     Cervical back: Normal range of motion and neck supple.   Lymphadenopathy:    She has no cervical adenopathy.  Neurological: She is alert and oriented to person, place, and time.  Skin: Skin is warm and dry.     Assessment & Plan:   Seasonal allergic rhinitis due to pollen [name] is a 29 yo F w/ PMH of childhood asthma and iron deficiency presenting to [facility] to establish care. She mentions that over the last 4-5 months, she has been experiencing significant runny nose and itchy eyes. She mentions trying variety of allergy meds and had mild improvement with Allegra but feels she needs something stronger. She hast not noticed any obvious triggers but she did recently move to an area with more woods than her prior residence. She mentions that she has never had seasonal allergies before and is concerned that she may be developing new allergies.  A/P Presents w/ rhinitis. Likely due to allergy from pollen based on history. Physical exam consistent with allergic rhinitis. Information regarding different types of anti-histamine therapies provided. [name] expressed understanding.  - Script for levocetirizine sent - Flonase and instruction on proper use provided  Anemia in pregnancy, third trimester Lab Results  Component Value Date   WBC 14.1 (H) 12/08/2017   HGB 7.6 (L) 12/08/2017   HCT 26.0 (L) 12/08/2017   MCV 72.0 (L) 12/08/2017   PLT 257 12/08/2017   Prior hx of iron deficiency anemia associated with pregnancy in the past. Prescribed PO iron supplementation in the past. Mentions intermittent adherence due to being busy taking care of her child. Chart review shows last cbc in 2019 w/ hgb  of 7.6. Will monitor  - cbc, ferritin - May need IV infusion if low  BMI 40.0-44.9, adult (HCC) Presents w/ BMI of 40. Mentions family hx of early heart disease. Concerning for metabolic syndrome and complications of obesity including hyperlipidemia and diabetes. Will get random bg with Bmp and lipid panel.  - Bmp, Lipid panel.    Patient  discussed with Dr. Heide Spark   -Judeth Cornfield, PGY2 [facility] Internal Medicine Pager: [phone]

## 2019-11-02 NOTE — Assessment & Plan Note (Signed)
Lab Results  Component Value Date   WBC 14.1 (H) 12/08/2017   HGB 7.6 (L) 12/08/2017   HCT 26.0 (L) 12/08/2017   MCV 72.0 (L) 12/08/2017   PLT 257 12/08/2017   Prior hx of iron deficiency anemia associated with pregnancy in the past. Prescribed PO iron supplementation in the past. Mentions intermittent adherence due to being busy taking care of her child. Chart review shows last cbc in 2019 w/ hgb of 7.6. Will monitor  - cbc, ferritin - May need IV infusion if low

## 2019-11-02 NOTE — Assessment & Plan Note (Signed)
Presents w/ BMI of 40. Mentions family hx of early heart disease. Concerning for metabolic syndrome and complications of obesity including hyperlipidemia and diabetes. Will get random bg with Bmp and lipid panel.  - Bmp, Lipid panel.

## 2019-11-02 NOTE — Assessment & Plan Note (Signed)
Maria Bruce is a 29 yo F w/ PMH of childhood asthma and iron deficiency presenting to [facility] to establish care. She mentions that over the last 4-5 months, she has been experiencing significant runny nose and itchy eyes. She mentions trying variety of allergy meds and had mild improvement with Allegra but feels she needs something stronger. She hast not noticed any obvious triggers but she did recently move to an area with more woods than her prior residence. She mentions that she has never had seasonal allergies before and is concerned that she may be developing new allergies.  A/P Presents w/ rhinitis. Likely due to allergy from pollen based on history. Physical exam consistent with allergic rhinitis. Information regarding different types of anti-histamine therapies provided. Maria Bruce expressed understanding.  - Script for levocetirizine sent - Flonase and instruction on proper use provided

## 2019-11-03 ENCOUNTER — Telehealth: Payer: Self-pay | Admitting: Internal Medicine

## 2019-11-03 LAB — CBC
Hematocrit: 35.6 % (ref 34.0–46.6)
Hemoglobin: 11.2 g/dL (ref 11.1–15.9)
MCH: 25.6 pg — ABNORMAL LOW (ref 26.6–33.0)
MCHC: 31.5 g/dL (ref 31.5–35.7)
MCV: 81 fL (ref 79–97)
Platelets: 284 10*3/uL (ref 150–450)
RBC: 4.38 x10E6/uL (ref 3.77–5.28)
RDW: 14.2 % (ref 11.7–15.4)
WBC: 6.6 10*3/uL (ref 3.4–10.8)

## 2019-11-03 LAB — LIPID PANEL
Chol/HDL Ratio: 2.4 ratio (ref 0.0–4.4)
Cholesterol, Total: 124 mg/dL (ref 100–199)
HDL: 51 mg/dL (ref >39)
LDL Chol Calc (NIH): 62 mg/dL (ref 0–99)
Triglycerides: 44 mg/dL (ref 0–149)
VLDL Cholesterol Cal: 11 mg/dL (ref 5–40)

## 2019-11-03 LAB — BMP8+ANION GAP
Anion Gap: 15 mmol/L (ref 10.0–18.0)
BUN/Creatinine Ratio: 13 (ref 9–23)
BUN: 9 mg/dL (ref 6–20)
CO2: 24 mmol/L (ref 20–29)
Calcium: 9.4 mg/dL (ref 8.7–10.2)
Chloride: 101 mmol/L (ref 96–106)
Creatinine, Ser: 0.7 mg/dL (ref 0.57–1.00)
GFR calc Af Amer: 138 mL/min/{1.73_m2} (ref >59)
GFR calc non Af Amer: 120 mL/min/{1.73_m2} (ref >59)
Glucose: 103 mg/dL — ABNORMAL HIGH (ref 65–99)
Potassium: 4.4 mmol/L (ref 3.5–5.2)
Sodium: 140 mmol/L (ref 134–144)

## 2019-11-03 LAB — FERRITIN: Ferritin: 19 ng/mL (ref 15–150)

## 2019-11-03 NOTE — Telephone Encounter (Signed)
Discussed w/ Maria Bruce her lab results. Advised daily adherence to oral iron supplement as her ferritin was borderline low. Maria Bruce expressed understanding. All other questions and concerns addressed.

## 2019-11-07 NOTE — Progress Notes (Signed)
Internal Medicine Clinic Attending ° °Case discussed with Dr. Lee at the time of the visit.  We reviewed the resident’s history and exam and pertinent patient test results.  I agree with the assessment, diagnosis, and plan of care documented in the resident’s note.  °

## 2019-12-04 ENCOUNTER — Ambulatory Visit: Payer: Medicaid Other

## 2019-12-13 ENCOUNTER — Ambulatory Visit (INDEPENDENT_AMBULATORY_CARE_PROVIDER_SITE_OTHER): Payer: Medicaid Other

## 2019-12-13 ENCOUNTER — Other Ambulatory Visit: Payer: Self-pay

## 2019-12-13 VITALS — BP 110/70 | HR 89 | Wt 239.4 lb

## 2019-12-13 DIAGNOSIS — Z3042 Encounter for surveillance of injectable contraceptive: Secondary | ICD-10-CM | POA: Diagnosis not present

## 2019-12-13 MED ORDER — MEDROXYPROGESTERONE ACETATE 150 MG/ML IM SUSP
150.0000 mg | Freq: Once | INTRAMUSCULAR | Status: AC
  Administered 2019-12-13: 150 mg via INTRAMUSCULAR

## 2019-12-13 NOTE — Progress Notes (Signed)
[facility] here for Depo-Provera  Injection.  Injection administered without complication. Patient will return in 3 months for next injection.  Ralene Bathe, RN 12/13/2019  8:54 AM

## 2019-12-13 NOTE — Progress Notes (Signed)
Chart reviewed for nurse visit. Agree with plan of care.   Marny Lowenstein, PA-C 12/13/2019 10:39 AM

## 2020-02-28 ENCOUNTER — Other Ambulatory Visit: Payer: Self-pay

## 2020-02-28 ENCOUNTER — Encounter: Payer: Self-pay | Admitting: Advanced Practice Midwife

## 2020-02-28 ENCOUNTER — Other Ambulatory Visit (HOSPITAL_COMMUNITY)
Admission: RE | Admit: 2020-02-28 | Discharge: 2020-02-28 | Disposition: A | Discharge status: Home / Self Care | Payer: Medicaid Other | Source: Ambulatory Visit | Attending: Advanced Practice Midwife | Admitting: Advanced Practice Midwife

## 2020-02-28 ENCOUNTER — Encounter: Payer: Self-pay | Admitting: Family Medicine

## 2020-02-28 ENCOUNTER — Ambulatory Visit (INDEPENDENT_AMBULATORY_CARE_PROVIDER_SITE_OTHER): Payer: Medicaid Other | Admitting: Advanced Practice Midwife

## 2020-02-28 VITALS — BP 122/64 | HR 93 | Ht 64.0 in | Wt 246.7 lb

## 2020-02-28 DIAGNOSIS — Z0000 Encounter for general adult medical examination without abnormal findings: Secondary | ICD-10-CM

## 2020-02-28 DIAGNOSIS — Z124 Encounter for screening for malignant neoplasm of cervix: Secondary | ICD-10-CM | POA: Diagnosis not present

## 2020-02-28 DIAGNOSIS — Z3042 Encounter for surveillance of injectable contraceptive: Secondary | ICD-10-CM | POA: Diagnosis not present

## 2020-02-28 MED ORDER — MEDROXYPROGESTERONE ACETATE 150 MG/ML IM SUSP
150.0000 mg | Freq: Once | INTRAMUSCULAR | Status: AC
  Administered 2020-02-28: 150 mg via INTRAMUSCULAR

## 2020-02-28 NOTE — Patient Instructions (Signed)

## 2020-02-28 NOTE — Progress Notes (Signed)
GYNECOLOGY ANNUAL PREVENTATIVE CARE ENCOUNTER NOTE  Subjective:   Maria Bruce is a 29 y.o. [phone] female here for a routine annual gynecologic exam.  Current complaints: none.   Denies abnormal vaginal bleeding, discharge, pelvic pain, problems with intercourse or other gynecologic concerns.    Gynecologic History No LMP recorded. Patient has had an injection. Contraception: Depo-Provera injections Last Pap: 06/23/2016. Results were: normal Last mammogram: NA, age. Results were: NA  Obstetric History OB History  Gravida Para Term Preterm AB Living  6 6 6  0 0 6  SAB TAB Ectopic Multiple Live Births  0 0 0 0 6    # Outcome Date GA Lbr Len/2nd Weight Sex Delivery Anes PTL Lv  6 Term 12/07/17 [redacted]w[redacted]d 09:38 / 00:05 7 lb 9.3 oz (3.44 kg) F Vag-Spont EPI  LIV  5 Term 07/16/16 [redacted]w[redacted]d / 00:19 7 lb 3 oz (3.26 kg) F Vag-Spont EPI  LIV  4 Term 10/24/14 [redacted]w[redacted]d 05:02 / 00:47 8 lb 4.8 oz (3.765 kg) M Vag-Spont EPI  LIV     Birth Comments: none  3 Term 09/29/13 [redacted]w[redacted]d 07:23 / 00:12 7 lb 6.4 oz (3.357 kg) F Vag-Spont EPI  LIV  2 Term 03/17/12 [redacted]w[redacted]d 05:07 / 00:25 6 lb 5.4 oz (2.875 kg) M Vag-Spont EPI  LIV  1 Term 2011     Vag-Spont   LIV    Past Medical History:  Diagnosis Date  . Asthma    as a child    Past Surgical History:  Procedure Laterality Date  . NO PAST SURGERIES      Current Outpatient Medications on File Prior to Visit  Medication Sig Dispense Refill  . Ferrous Fumarate-Folic Acid 324-1 MG TABS Take 1 tablet by mouth 2 (two) times daily. (Patient not taking: Reported on 02/28/2020) 60 each 0  . fluticasone (FLONASE) 50 MCG/ACT nasal spray Place 1 spray into both nostrils daily. (Patient not taking: Reported on 12/13/2019) 11.1 mL 2   No current facility-administered medications on file prior to visit.    No Known Allergies  Social History   Socioeconomic History  . Marital status: Single    Spouse name: Not on file  . Number of children: Not on file  . Years of  education: Not on file  . Highest education level: Not on file  Occupational History  . Not on file  Tobacco Use  . Smoking status: Never Smoker  . Smokeless tobacco: Never Used  Substance and Sexual Activity  . Alcohol use: No  . Drug use: No  . Sexual activity: Yes    Birth control/protection: None  Other Topics Concern  . Not on file  Social History Narrative  . Not on file   Social Determinants of Health   Financial Resource Strain:   . Difficulty of Paying Living Expenses: Not on file  Food Insecurity:   . Worried About 02/13/2020 in the Last Year: Not on file  . Ran Out of Food in the Last Year: Not on file  Transportation Needs:   . Lack of Transportation (Medical): Not on file  . Lack of Transportation (Non-Medical): Not on file  Physical Activity:   . Days of Exercise per Week: Not on file  . Minutes of Exercise per Session: Not on file  Stress:   . Feeling of Stress : Not on file  Social Connections:   . Frequency of Communication with Friends and Family: Not on file  . Frequency of Social Gatherings with  Friends and Family: Not on file  . Attends Religious Services: Not on file  . Active Member of Clubs or Organizations: Not on file  . Attends Banker Meetings: Not on file  . Marital Status: Not on file  Intimate Partner Violence:   . Fear of Current or Ex-Partner: Not on file  . Emotionally Abused: Not on file  . Physically Abused: Not on file  . Sexually Abused: Not on file    Family History  Problem Relation Age of Onset  . Heart attack Mother   . Hypertension Paternal Grandmother   . Diabetes Paternal Grandmother     The following portions of the patient's history were reviewed and updated as appropriate: allergies, current medications, past family history, past medical history, past social history, past surgical history and problem list.  Review of Systems Pertinent items noted in HPI and remainder of comprehensive ROS  otherwise negative.   Objective:  BP 122/64   Pulse 93   Ht 5\' 4"  (1.626 m)   Wt 246 lb 11.2 oz (111.9 kg)   BMI 42.35 kg/m  CONSTITUTIONAL: Well-developed, well-nourished female in no acute distress.  HENT:  Normocephalic, atraumatic, External right and left ear normal. Oropharynx is clear and moist EYES: Conjunctivae and EOM are normal. Pupils are equal, round, and reactive to light. No scleral icterus.  NECK: Normal range of motion, supple, no masses.  Normal thyroid.  SKIN: Skin is warm and dry. No rash noted. Not diaphoretic. No erythema. No pallor. NEUROLOGIC: Alert and oriented to person, place, and time. Normal reflexes, muscle tone coordination. No cranial nerve deficit noted. PSYCHIATRIC: Normal mood and affect. Normal behavior. Normal judgment and thought content. CARDIOVASCULAR: Normal heart rate noted, regular rhythm RESPIRATORY: Clear to auscultation bilaterally. Effort and breath sounds normal, no problems with respiration noted. BREASTS: Symmetric in size. No masses, skin changes, nipple drainage, or lymphadenopathy. ABDOMEN: Soft, normal bowel sounds, no distention noted.  No tenderness, rebound or guarding.  PELVIC: Normal appearing external genitalia; normal appearing vaginal mucosa and cervix.  No abnormal discharge noted.  Pap smear obtained.  Normal uterine size, no other palpable masses, no uterine or adnexal tenderness. MUSCULOSKELETAL: Normal range of motion. No tenderness.  No cyanosis, clubbing, or edema.  2+ distal pulses.   Assessment and Plan:  1. Annual physical exam  - Cytology - PAP( [place]) - Cervicovaginal ancillary only( [facility]) - Patient has not had covid vaccine, recommended vaccine to patient.  - depo today, due 12/8-12/22 for next dose  - Annual exam in one year   2. Pap smear for cervical cancer screening   Will follow up results of pap smear and manage accordingly. Routine preventative health maintenance measures  emphasized. Please refer to After Visit Summary for other counseling recommendations.    06-03-1995 DNP, CNM  02/28/20  8:38 AM

## 2020-02-29 ENCOUNTER — Encounter: Payer: Self-pay | Admitting: Advanced Practice Midwife

## 2020-02-29 ENCOUNTER — Other Ambulatory Visit: Payer: Self-pay | Admitting: *Deleted

## 2020-02-29 LAB — CERVICOVAGINAL ANCILLARY ONLY
Bacterial Vaginitis (gardnerella): NEGATIVE
Candida Glabrata: NEGATIVE
Candida Vaginitis: POSITIVE — AB
Chlamydia: NEGATIVE
Comment: NEGATIVE
Comment: NEGATIVE
Comment: NEGATIVE
Comment: NEGATIVE
Comment: NEGATIVE
Comment: NORMAL
Neisseria Gonorrhea: NEGATIVE
Trichomonas: NEGATIVE

## 2020-02-29 MED ORDER — FLUCONAZOLE 150 MG PO TABS: 150.0000 mg | ORAL_TABLET | Freq: Once | ORAL | 0 refills | Status: AC

## 2020-03-05 LAB — CYTOLOGY - PAP
Comment: NEGATIVE
Diagnosis: NEGATIVE
High risk HPV: NEGATIVE

## 2020-05-15 ENCOUNTER — Other Ambulatory Visit: Payer: Self-pay

## 2020-05-15 ENCOUNTER — Ambulatory Visit (INDEPENDENT_AMBULATORY_CARE_PROVIDER_SITE_OTHER): Payer: Medicaid Other | Admitting: *Deleted

## 2020-05-15 VITALS — BP 105/61 | HR 96 | Ht 64.0 in | Wt 247.5 lb

## 2020-05-15 DIAGNOSIS — Z3042 Encounter for surveillance of injectable contraceptive: Secondary | ICD-10-CM

## 2020-05-15 MED ORDER — MEDROXYPROGESTERONE ACETATE 150 MG/ML IM SUSP
150.0000 mg | Freq: Once | INTRAMUSCULAR | Status: AC
  Administered 2020-05-15: 150 mg via INTRAMUSCULAR

## 2020-05-15 NOTE — Progress Notes (Signed)
[facility] here for Depo-Provera Injection. Injection administered without complication. Patient will return in 3 months for next injection between 07/31/20 and 08/14/2020. Next annual visit due 02/27/21.   Abdulraheem Pineo,RN 05/15/2020  9:03 AM

## 2020-05-15 NOTE — Progress Notes (Signed)
Agree with A & P. 

## 2020-07-04 ENCOUNTER — Ambulatory Visit (INDEPENDENT_AMBULATORY_CARE_PROVIDER_SITE_OTHER): Payer: Medicaid Other | Admitting: Internal Medicine

## 2020-07-04 ENCOUNTER — Encounter: Payer: Self-pay | Admitting: Internal Medicine

## 2020-07-04 VITALS — BP 128/63 | HR 104 | Temp 98.2°F | Ht 64.0 in | Wt 255.0 lb

## 2020-07-04 DIAGNOSIS — M545 Low back pain, unspecified: Secondary | ICD-10-CM

## 2020-07-04 DIAGNOSIS — R0981 Nasal congestion: Secondary | ICD-10-CM | POA: Diagnosis not present

## 2020-07-04 DIAGNOSIS — G479 Sleep disorder, unspecified: Secondary | ICD-10-CM | POA: Diagnosis not present

## 2020-07-04 DIAGNOSIS — G8929 Other chronic pain: Secondary | ICD-10-CM

## 2020-07-04 NOTE — Patient Instructions (Signed)
Maria Bruce, It was nice meeting you!  Today we discussed:   1. Sleep: remember to try to develop a good sleep routine - do the same thing every night, try to avoid screen time 30 minutes before bed, and take melatonin about an hour before you're going to try to sleep.   2. Allergies/congestion: try sleeping with a humidifier at night. I would take an allergy pill daily as well as Flonase to try to help. I will also refer you to an allergist for skin testing to see what your specific triggers are.   3. Low back pain: this seems to be a fairly superficial problem (skin/muscle tissue). I recommend trying topical pain creams (be sure they have lidocaine) to see if this helps.   Take care, and we'll you see you in 1 year. Please call sooner if you have questions or something comes up.   Dr. Chesley Mires

## 2020-07-04 NOTE — Progress Notes (Unsigned)
Established Patient Office Visit  Subjective:  Patient ID: Mardella Nuckles, female    DOB: 05/22/1991  Age: 30 y.o. MRN: [id number]  CC: annual physical    HPI Evelin Cake presents for annual physical and concerns of chronic congestion, poor sleep. Please see problem based charting for details regarding today's visit.   Past Medical History:  Diagnosis Date  . Asthma    as a child    Past Surgical History:  Procedure Laterality Date  . NO PAST SURGERIES      Family History  Problem Relation Age of Onset  . Heart attack Mother   . Hypertension Paternal Grandmother   . Diabetes Paternal Grandmother     Social History   Socioeconomic History  . Marital status: Single    Spouse name: Not on file  . Number of children: Not on file  . Years of education: Not on file  . Highest education level: Not on file  Occupational History  . Not on file  Tobacco Use  . Smoking status: Never Smoker  . Smokeless tobacco: Never Used  Substance and Sexual Activity  . Alcohol use: No  . Drug use: No  . Sexual activity: Yes    Birth control/protection: None  Other Topics Concern  . Not on file  Social History Narrative  . Not on file   Social Determinants of Health   Financial Resource Strain: Not on file  Food Insecurity: No Food Insecurity  . Worried About Programme researcher, broadcasting/film/video in the Last Year: Never true  . Ran Out of Food in the Last Year: Never true  Transportation Needs: No Transportation Needs  . Lack of Transportation (Medical): No  . Lack of Transportation (Non-Medical): No  Physical Activity: Not on file  Stress: Not on file  Social Connections: Not on file  Intimate Partner Violence: Not on file    Outpatient Medications Prior to Visit  Medication Sig Dispense Refill  . Ferrous Fumarate-Folic Acid 324-1 MG TABS Take 1 tablet by mouth 2 (two) times daily. 60 each 0  . fluticasone (FLONASE) 50 MCG/ACT nasal spray Place 1 spray into both  nostrils daily. (Patient not taking: Reported on 12/13/2019) 11.1 mL 2   No facility-administered medications prior to visit.    No Known Allergies  ROS Review of Systems  Constitutional: Negative for chills, fever and unexpected weight change.  HENT: Positive for congestion, postnasal drip and sneezing. Negative for dental problem, ear pain, facial swelling, hearing loss, sore throat and trouble swallowing.   Eyes: Positive for itching. Negative for visual disturbance.  Respiratory: Negative for cough, shortness of breath and wheezing.   Cardiovascular: Negative for chest pain and palpitations.  Gastrointestinal: Negative for abdominal pain, constipation, diarrhea and nausea.  Endocrine: Negative for polydipsia and polyuria.  Genitourinary: Negative for dysuria and menstrual problem.  Musculoskeletal: Positive for back pain. Negative for arthralgias and gait problem.  Skin: Negative for rash.  Allergic/Immunologic: Positive for environmental allergies.  Neurological: Negative for syncope, weakness, numbness and headaches.  Psychiatric/Behavioral: Positive for sleep disturbance. Negative for decreased concentration and dysphoric mood. The patient is not hyperactive.       Objective:    Physical Exam Constitutional:      General: She is not in acute distress.    Appearance: Normal appearance.  HENT:     Nose: Mucosal edema and congestion present.     Right Sinus: No maxillary sinus tenderness or frontal sinus tenderness.     Left  Sinus: No maxillary sinus tenderness or frontal sinus tenderness.     Mouth/Throat:     Mouth: Mucous membranes are moist.     Pharynx: Oropharynx is clear.  Eyes:     General: Allergic shiner present.  Cardiovascular:     Rate and Rhythm: Normal rate and regular rhythm.     Pulses: Normal pulses.  Pulmonary:     Effort: Pulmonary effort is normal.     Breath sounds: Normal breath sounds.  Abdominal:     General: Bowel sounds are normal. There is  no distension.     Palpations: Abdomen is soft.     Tenderness: There is no abdominal tenderness.  Musculoskeletal:     Cervical back: Neck supple.     Right lower leg: No edema.     Left lower leg: No edema.     Comments: TTP of lumbar paraspinal muscles on right. Pain worse with right side-bending and flexion.   Lymphadenopathy:     Cervical: No cervical adenopathy.  Skin:    General: Skin is warm and dry.  Neurological:     General: No focal deficit present.     Mental Status: She is alert.  Psychiatric:        Mood and Affect: Mood normal.        Behavior: Behavior normal.     BP 128/63 (BP Location: Left Arm, Patient Position: Sitting, Cuff Size: Normal)   Pulse (!) 104   Temp 98.2 F (36.8 C) (Oral)   Ht 5\' 4"  (1.626 m)   Wt 255 lb (115.7 kg)   SpO2 100%   BMI 43.77 kg/m  Wt Readings from Last 3 Encounters:  07/04/20 255 lb (115.7 kg)  05/15/20 247 lb 8 oz (112.3 kg)  02/28/20 246 lb 11.2 oz (111.9 kg)     Health Maintenance Due  Topic Date Due  . Hepatitis C Screening  Never done  . COVID-19 Vaccine (1) Never done  . INFLUENZA VACCINE  01/07/2020    There are no preventive care reminders to display for this patient.  No results found for: TSH Lab Results  Component Value Date   WBC 6.6 11/02/2019   HGB 11.2 11/02/2019   HCT 35.6 11/02/2019   MCV 81 11/02/2019   PLT 284 11/02/2019   Lab Results  Component Value Date   NA 140 11/02/2019   K 4.4 11/02/2019   CO2 24 11/02/2019   GLUCOSE 103 (H) 11/02/2019   BUN 9 11/02/2019   CREATININE 0.70 11/02/2019   CALCIUM 9.4 11/02/2019   ANIONGAP 11 08/11/2017   Lab Results  Component Value Date   CHOL 124 11/02/2019   Lab Results  Component Value Date   HDL 51 11/02/2019   Lab Results  Component Value Date   LDLCALC 62 11/02/2019   Lab Results  Component Value Date   TRIG 44 11/02/2019   Lab Results  Component Value Date   CHOLHDL 2.4 11/02/2019   No results found for: HGBA1C     Assessment & Plan:   Problem List Items Addressed This Visit      Other   Chronic nasal congestion - Primary    Patient states she has had chronic nasal congestion that worsened when she moved to this area. She notes symptoms flare during certain times of the year with associated sneezing and watery eyes, but she feels like her nose is congested constantly. She takes an antihistamine intermittently.  I advised her to try a humidifier at  night, as well as daily antihistamine and flonase. She would also like a referral to an allergist to see what specific triggers she has.       Relevant Orders   Ambulatory referral to Allergy   Difficulty sleeping    Ms. Rikki Spearing endorses a difficult time falling asleep. She realizes having six children makes it difficult to begin with, but even when they are all sleeping she still has trouble.  We discussed sleep hygiene, including consistent bedtime routine, no screen time 30 mins before bed, melatonin an hour before she tries to fall asleep, and sleeping in an cool, quiet, and dark room.       Chronic left-sided low back pain without sciatica    Patient endorses chronic left-sided low back pain ever since her most recent epidural which was approximately 2 years ago. She denies any other known injury. Symptoms worse with bending or if one of her kids accidentally touches the area.  Based on exam, it seems to be a combination of tight paraspinal muscles, as well as hypersensitive skin in that area. I have recommended topical pain cream with lidocaine to see if this gives her any symptomatic relief.          No orders of the defined types were placed in this encounter.   Follow-up: Return in about 1 year (around 07/04/2021) for annual physical .    Bridget Hartshorn, DO

## 2020-07-05 ENCOUNTER — Encounter: Payer: Self-pay | Admitting: Internal Medicine

## 2020-07-05 DIAGNOSIS — G479 Sleep disorder, unspecified: Secondary | ICD-10-CM | POA: Insufficient documentation

## 2020-07-05 DIAGNOSIS — M545 Low back pain, unspecified: Secondary | ICD-10-CM | POA: Insufficient documentation

## 2020-07-05 DIAGNOSIS — R0981 Nasal congestion: Secondary | ICD-10-CM | POA: Insufficient documentation

## 2020-07-05 NOTE — Assessment & Plan Note (Signed)
Ms. Maria Bruce endorses a difficult time falling asleep. She realizes having six children makes it difficult to begin with, but even when they are all sleeping she still has trouble.  We discussed sleep hygiene, including consistent bedtime routine, no screen time 30 mins before bed, melatonin an hour before she tries to fall asleep, and sleeping in an cool, quiet, and dark room.

## 2020-07-05 NOTE — Assessment & Plan Note (Signed)
Patient endorses chronic left-sided low back pain ever since her most recent epidural which was approximately 2 years ago. She denies any other known injury. Symptoms worse with bending or if one of her kids accidentally touches the area.  Based on exam, it seems to be a combination of tight paraspinal muscles, as well as hypersensitive skin in that area. I have recommended topical pain cream with lidocaine to see if this gives her any symptomatic relief.

## 2020-07-05 NOTE — Assessment & Plan Note (Signed)
Patient states she has had chronic nasal congestion that worsened when she moved to this area. She notes symptoms flare during certain times of the year with associated sneezing and watery eyes, but she feels like her nose is congested constantly. She takes an antihistamine intermittently.  I advised her to try a humidifier at night, as well as daily antihistamine and flonase. She would also like a referral to an allergist to see what specific triggers she has.

## 2020-07-10 NOTE — Progress Notes (Signed)
[facility] Attending  Case discussed with Dr. Bloomfield  At the time of the visit.  We reviewed the resident's history and exam and pertinent patient test results.  I agree with the assessment, diagnosis, and plan of care documented in the resident's note.  

## 2020-07-31 ENCOUNTER — Other Ambulatory Visit: Payer: Self-pay

## 2020-07-31 ENCOUNTER — Ambulatory Visit (INDEPENDENT_AMBULATORY_CARE_PROVIDER_SITE_OTHER): Payer: Medicaid Other

## 2020-07-31 VITALS — BP 114/74 | HR 86 | Wt 253.3 lb

## 2020-07-31 DIAGNOSIS — Z3042 Encounter for surveillance of injectable contraceptive: Secondary | ICD-10-CM | POA: Diagnosis not present

## 2020-07-31 MED ORDER — MEDROXYPROGESTERONE ACETATE 150 MG/ML IM SUSP
150.0000 mg | Freq: Once | INTRAMUSCULAR | Status: AC
  Administered 2020-07-31: 150 mg via INTRAMUSCULAR

## 2020-07-31 NOTE — Progress Notes (Signed)
[facility] here for Depo-Provera Injection. Injection administered without complication. Patient will return in 3 months for next injection between May 11 and May 25. Next annual visit due 02/2021 .   Ralene Bathe, RN 07/31/2020  8:44 AM

## 2020-08-21 ENCOUNTER — Encounter: Payer: Self-pay | Admitting: Allergy

## 2020-08-21 ENCOUNTER — Ambulatory Visit (INDEPENDENT_AMBULATORY_CARE_PROVIDER_SITE_OTHER): Payer: Medicaid Other | Admitting: Allergy

## 2020-08-21 ENCOUNTER — Other Ambulatory Visit: Payer: Self-pay

## 2020-08-21 VITALS — BP 112/66 | HR 97 | Temp 98.6°F | Resp 14 | Ht 64.0 in | Wt 255.2 lb

## 2020-08-21 DIAGNOSIS — J3089 Other allergic rhinitis: Secondary | ICD-10-CM | POA: Diagnosis not present

## 2020-08-21 DIAGNOSIS — H1013 Acute atopic conjunctivitis, bilateral: Secondary | ICD-10-CM | POA: Diagnosis not present

## 2020-08-21 DIAGNOSIS — Z8709 Personal history of other diseases of the respiratory system: Secondary | ICD-10-CM

## 2020-08-21 MED ORDER — AZELASTINE HCL 0.05 % OP SOLN: 1.0000 [drp] | Freq: Two times a day (BID) | OPHTHALMIC | 5 refills | Status: DC | PRN

## 2020-08-21 MED ORDER — AZELASTINE HCL 0.1 % NA SOLN: 1.0000 | Freq: Two times a day (BID) | NASAL | 5 refills | Status: AC | PRN

## 2020-08-21 MED ORDER — CETIRIZINE HCL 10 MG PO TABS: 10.0000 mg | ORAL_TABLET | Freq: Two times a day (BID) | ORAL | 5 refills | Status: AC

## 2020-08-21 MED ORDER — FLUTICASONE PROPIONATE 50 MCG/ACT NA SUSP: 1.0000 | Freq: Two times a day (BID) | NASAL | 5 refills | Status: AC

## 2020-08-21 MED ORDER — MONTELUKAST SODIUM 10 MG PO TABS: 10.0000 mg | ORAL_TABLET | Freq: Every day | ORAL | 5 refills | Status: AC

## 2020-08-21 NOTE — Assessment & Plan Note (Addendum)
Perennial rhinoconjunctivitis symptoms for the last 2+ years since she moved to [place].  Worse during the spring and summer.  Tried Allegra, Claritin, Flonase and over-the-counter eyedrops with some benefit.  No prior allergy/ENT evaluation.  3 cats at home.  Today's skin testing showed: Positive to grass pollen, weed pollen, tree pollen, mold, cockroach, mouse, tobacco, cat, dust mites. Borderline positive to ragweed pollen.   Start environmental control measures as below.  May use over the counter antihistamines such as Zyrtec (cetirizine), Claritin (loratadine), Allegra (fexofenadine), or Xyzal (levocetirizine) daily as needed. May take twice a day during flares.  May use Flonase (fluticasone) nasal spray 1 spray per nostril twice a day as needed for nasal congestion.   May use azelastine nasal spray 1-2 sprays per nostril twice a day as needed for runny nose/drainage. Start Singulair (montelukast) 10mg  daily at night. Cautioned that in some children/adults can experience behavioral changes including hyperactivity, agitation, depression, sleep disturbances and suicidal ideations. These side effects are rare, but if you notice them you should notify me and discontinue Singulair (montelukast).  May use Optivar (azelastine) eye drops 0.05% twice a day as needed for itchy/watery eyes.  Read about allergy injections - handout given.

## 2020-08-21 NOTE — Patient Instructions (Addendum)
Today's skin testing showed: Positive to grass pollen, weed pollen, tree pollen, mold, cockroach, mouse, tobacco, cat, dust mites. Borderline positive to ragweed pollen.   Environmental allergies  Start environmental control measures as below.  May use over the counter antihistamines such as Zyrtec (cetirizine), Claritin (loratadine), Allegra (fexofenadine), or Xyzal (levocetirizine) daily as needed. May take twice a day during flares.  May use Flonase (fluticasone) nasal spray 1 spray per nostril twice a day as needed for nasal congestion.   May use azelastine nasal spray 1-2 sprays per nostril twice a day as needed for runny nose/drainage. Start Singulair (montelukast) 10mg  daily at night. Cautioned that in some children/adults can experience behavioral changes including hyperactivity, agitation, depression, sleep disturbances and suicidal ideations. These side effects are rare, but if you notice them you should notify me and discontinue Singulair (montelukast).  May use Optivar (azelastine) eye drops 0.05% twice a day as needed for itchy/watery eyes.  Read about allergy injections - handout given.   History of asthma:  Normal breathing test today.   Follow up in 2 months or sooner if needed.   Reducing Pollen Exposure . Pollen seasons: trees (spring), grass (summer) and ragweed/weeds (fall). [birth year]1980 Keep windows closed in your home and car to lower pollen exposure.  Marland Kitchen air conditioning in the bedroom and throughout the house if possible.  . Avoid going out in dry windy days - especially early morning. . Pollen counts are highest between 5 - 10 AM and on dry, hot and windy days.  . Save outside activities for late afternoon or after a heavy rain, when pollen levels are lower.  . Avoid mowing of grass if you have grass pollen allergy. Lilian Kapur Be aware that pollen can also be transported indoors on people and pets.  . Dry your clothes in an automatic dryer rather than hanging them  outside where they might collect pollen.  . Rinse hair and eyes before bedtime. Mold Control . Mold and fungi can grow on a variety of surfaces provided certain temperature and moisture conditions exist.  . Outdoor molds grow on plants, decaying vegetation and soil. The major outdoor mold, Alternaria and Cladosporium, are found in very high numbers during hot and dry conditions. Generally, a late summer - fall peak is seen for common outdoor fungal spores. Rain will temporarily lower outdoor mold spore count, but counts rise rapidly when the rainy period ends. . The most important indoor molds are Aspergillus and Penicillium. Dark, humid and poorly ventilated basements are ideal sites for mold growth. The next most common sites of mold growth are the bathroom and the kitchen. Outdoor (Seasonal) Mold Control . Use air conditioning and keep windows closed. . Avoid exposure to decaying vegetation. 04-03-1974 Avoid leaf raking. . Avoid grain handling. . Consider wearing a face mask if working in moldy areas.  Indoor (Perennial) Mold Control  . Maintain humidity below 50%. . Get rid of mold growth on hard surfaces with water, detergent and, if necessary, 5% bleach (do not mix with other cleaners). Then dry the area completely. If mold covers an area more than 10 square feet, consider hiring an indoor environmental professional. . For clothing, washing with soap and water is best. If moldy items cannot be cleaned and dried, throw them away. . Remove sources e.g. contaminated carpets. . Repair and seal leaking roofs or pipes. Using dehumidifiers in damp basements may be helpful, but empty the water and clean units regularly to prevent mildew from forming. All rooms, especially  basements, bathrooms and kitchens, require ventilation and cleaning to deter mold and mildew growth. Avoid carpeting on concrete or damp floors, and storing items in damp areas. Cockroach Allergen Avoidance Cockroaches are often found in  the homes of densely populated urban areas, schools or commercial buildings, but these creatures can lurk almost anywhere. This does not mean that you have a dirty house or living area. . Block all areas where roaches can enter the home. This includes crevices, wall cracks and windows.  . Cockroaches need water to survive, so fix and seal all leaky faucets and pipes. Have an exterminator go through the house when your family and pets are gone to eliminate any remaining roaches. Marland Kitchen Keep food in lidded containers and put pet food dishes away after your pets are done eating. Vacuum and sweep the floor after meals, and take out garbage and recyclables. Use lidded garbage containers in the kitchen. Wash dishes immediately after use and clean under stoves, refrigerators or toasters where crumbs can accumulate. Wipe off the stove and other kitchen surfaces and cupboards regularly. Control of House Dust Mite Allergen . Dust mite allergens are a common trigger of allergy and asthma symptoms. While they can be found throughout the house, these microscopic creatures thrive in warm, humid environments such as bedding, upholstered furniture and carpeting. . Because so much time is spent in the bedroom, it is essential to reduce mite levels there.  . Encase pillows, mattresses, and box springs in special allergen-proof fabric covers or airtight, zippered plastic covers.  . Bedding should be washed weekly in hot water (130 F) and dried in a hot dryer. Allergen-proof covers are available for comforters and pillows that can't be regularly washed.  Reyes Ivan the allergy-proof covers every few months. Minimize clutter in the bedroom. Keep pets out of the bedroom.  Marland Kitchen Keep humidity less than 50% by using a dehumidifier or air conditioning. You can buy a humidity measuring device called a hygrometer to monitor this.  . If possible, replace carpets with hardwood, linoleum, or washable area rugs. If that's not possible, vacuum  frequently with a vacuum that has a HEPA filter. . Remove all upholstered furniture and non-washable window drapes from the bedroom. . Remove all non-washable stuffed toys from the bedroom.  Wash stuffed toys weekly.

## 2020-08-21 NOTE — Assessment & Plan Note (Signed)
   See assessment and plan as above for allergic rhinitis.  

## 2020-08-21 NOTE — Assessment & Plan Note (Signed)
History of asthma as a child. No inhaler use recently.  Today's spirometry was normal.  Monitor symptoms.

## 2020-08-21 NOTE — Progress Notes (Signed)
New Patient Note  RE: Maria Bruce MRN: [id number] DOB: [birth year] Date of Office Visit: 08/21/2020  Referring provider: Inez CatalinaMullen, Emily B, MD Primary care provider: Inez CatalinaMullen, Emily B, MD  Chief Complaint: Nasal Congestion (Says she is stuffy and cant breathe out her nose started about 2 years ago.) and Allergic Rhinitis  (Says she suffers from itchy watery eyes and runny nose)  History of Present Illness: I had the pleasure of seeing Maria Bruce for initial evaluation at the [facility] on 08/21/2020. She is a 30 y.o. female, who is referred here by Inez CatalinaMullen, Emily B, MD for the evaluation of chronic rhinitis.  She reports symptoms of nasal congestion, runny nose, itchy/watery eyes, sneezing. Symptoms have been going on for 2+ years. The symptoms are present all year around with worsening in spring and summer. Other triggers include exposure to unknown. Anosmia: no. Headache: no. She has used Allegra, Claritin, Flonase, OTC eye drops with some improvement in symptoms. Sinus infections: no. Previous work up includes: none. Previous ENT evaluation: no. Previous sinus imaging: no. History of nasal polyps: no. Last eye exam: more than 1 year ago. History of reflux: no.  07/04/2020 PCP visit: "Patient states she has had chronic nasal congestion that worsened when she moved to this area. She notes symptoms flare during certain times of the year with associated sneezing and watery eyes, but she feels like her nose is congested constantly. She takes an antihistamine intermittently.  I advised her to try a humidifier at night, as well as daily antihistamine and Flonase. She would also like a referral to an allergist to see what specific triggers she has. "  Assessment and Plan: Maria Bruce is a 30 y.o. female with: Other allergic rhinitis Perennial rhinoconjunctivitis symptoms for the last 2+ years since [place].  Worse during the spring and  summer.  Tried Allegra, Claritin, Flonase and over-the-counter eyedrops with some benefit.  No prior allergy/ENT evaluation.  3 cats at home.  Today's skin testing showed: Positive to grass pollen, weed pollen, tree pollen, mold, cockroach, mouse, tobacco, cat, dust mites. Borderline positive to ragweed pollen.   Start environmental control measures as below.  May use over the counter antihistamines such as Zyrtec (cetirizine), Claritin (loratadine), Allegra (fexofenadine), or Xyzal (levocetirizine) daily as needed. May take twice a day during flares.  May use Flonase (fluticasone) nasal spray 1 spray per nostril twice a day as needed for nasal congestion.   May use azelastine nasal spray 1-2 sprays per nostril twice a day as needed for runny nose/drainage. Start Singulair (montelukast) 10mg  daily at night. Cautioned that in some children/adults can experience behavioral changes including hyperactivity, agitation, depression, sleep disturbances and suicidal ideations. These side effects are rare, but if you notice them you should notify me and discontinue Singulair (montelukast).  May use Optivar (azelastine) eye drops 0.05% twice a day as needed for itchy/watery eyes.  Read about allergy injections - handout given.   Allergic conjunctivitis of both eyes  See assessment and plan as above for allergic rhinitis.  History of asthma History of asthma as a child. No inhaler use recently.  Today's spirometry was normal.  Monitor symptoms.   Return in about 2 months (around 10/21/2020).  Meds ordered this encounter  Medications  . fluticasone (FLONASE) 50 MCG/ACT nasal spray    Sig: Place 1 spray into both nostrils in the morning and at bedtime. For nasal congestion    Dispense:  16 g  Refill:  5  . azelastine (ASTELIN) 0.1 % nasal spray    Sig: Place 1-2 sprays into both nostrils 2 (two) times daily as needed (runny nose). Use in each nostril as directed    Dispense:  30 mL     Refill:  5  . montelukast (SINGULAIR) 10 MG tablet    Sig: Take 1 tablet (10 mg total) by mouth at bedtime.    Dispense:  30 tablet    Refill:  5  . azelastine (OPTIVAR) 0.05 % ophthalmic solution    Sig: Place 1 drop into both eyes 2 (two) times daily as needed (itchy/watery eyes).    Dispense:  6 mL    Refill:  5  . cetirizine (ZYRTEC ALLERGY) 10 MG tablet    Sig: Take 1 tablet (10 mg total) by mouth 2 (two) times daily.    Dispense:  60 tablet    Refill:  5   Lab Orders  No laboratory test(s) ordered today    Other allergy screening: Asthma: yes  No symptoms since age 24 and no recent inhaler use.  Denies any chest pain, wheezing.  Food allergy: no Medication allergy: no Hymenoptera allergy: no Urticaria: no Eczema:no History of recurrent infections suggestive of immunodeficency: no  Diagnostics: Spirometry:  Tracings reviewed. Her effort: Good reproducible efforts. FVC: 3.51L FEV1: 2.93L, 89% predicted FEV1/FVC ratio: 83% Interpretation: Spirometry consistent with normal pattern.  Please see scanned spirometry results for details.  Skin Testing: Environmental allergy panel. Positive to grass pollen, weed pollen, tree pollen, mold, cockroach, mouse, tobacco, cat, dust mites. Borderline positive to ragweed pollen.  Results discussed with patient/family.  Airborne Adult Perc - 08/21/20 0939    Time Antigen Placed 0102    Allergen Manufacturer Waynette Buttery    Location Back    Number of Test 59    1. Control-Buffer 50% Glycerol Negative    2. Control-Histamine 1 mg/ml 2+    3. Albumin saline Negative    4. Bahia Negative    5. French Southern Territ[place] Negative    6. Johnson 2+    7. Kentucky Blue 2+    8. Meadow Fescue Negative    9. Perennial Rye --   +/-   10. Sweet Vernal Negative    11. Timothy Negative    12. Cocklebur Negative    13. Burweed Marshelder Negative    14. Ragweed, short --   +/-   15. Ragweed, Giant --   +/-   16. Plantain,  English Negative    17. Lamb's  Quarters Negative    18. Sheep Sorrell 2+    19. Rough Pigweed --   +/-   20. Marsh Elder, Rough --   +/-   21. Mugwort, Common Negative    22. Ash mix 2+    23. Birch mix Negative    24. Van Clines American --   +/-   25. Box, Elder Negative    26. Cedar, red --   +/-   27. Cottonwood, Eastern Negative    28. Elm mix Negative    29. Hickory 3+    30. Maple mix 2+    31. Oak, Guinea-Bissau mix 2+    32. Pecan Pollen 3+    33. Pine mix --   +/-   34. Sycamore Eastern Negative    35. Walnut, Black Pollen Negative    36. Alternaria alternata 2+    37. Cladosporium Herbarum Negative    38. Aspergillus mix Negative    39. Penicillium  mix 2+    40. Bipolaris sorokiniana (Helminthosporium) Negative    41. Drechslera spicifera (Curvularia) Negative    42. Mucor plumbeus 2+    43. Fusarium moniliforme 2+    44. Aureobasidium pullulans (pullulara) Negative    45. Rhizopus oryzae Negative    46. Botrytis cinera Negative    47. Epicoccum nigrum --   +/-   48. Phoma betae --   +/-   49. Candida Albicans Negative    50. Trichophyton mentagrophytes Negative    51. Mite, D Farinae  5,000 AU/ml Negative    52. Mite, D Pteronyssinus  5,000 AU/ml Negative    53. Cat Hair 10,000 BAU/ml Negative    54.  Dog Epithelia Negative    55. Mixed Feathers Negative    56. Horse Epithelia Negative    57. Cockroach, German 2+    58. Mouse 2+    59. Tobacco Leaf 2+          Intradermal - 08/21/20 1053    Time Antigen Placed 1053    Allergen Manufacturer Greer    Location Arm    Number of Test 5    Intradermal Select    Control Negative    [place] Negative    Cat 2+    Dog Negative    Mite mix 4+           Past Medical History: Patient Active Problem List   Diagnosis Date Noted  . Other allergic rhinitis 08/21/2020  . Allergic conjunctivitis of both eyes 08/21/2020  . History of asthma 08/21/2020  . Chronic nasal congestion 07/05/2020  . Difficulty sleeping 07/05/2020  . Chronic left-sided  low back pain without sciatica 07/05/2020  . BMI 40.0-44.9, adult (HCC) 11/02/2019  . Anemia in pregnancy, third trimester 11/10/2017   Past Medical History:  Diagnosis Date  . Asthma    as a child   Past Surgical History: Past Surgical History:  Procedure Laterality Date  . NO PAST SURGERIES     Medication List:  Current Outpatient Medications  Medication Sig Dispense Refill  . azelastine (ASTELIN) 0.1 % nasal spray Place 1-2 sprays into both nostrils 2 (two) times daily as needed (runny nose). Use in each nostril as directed 30 mL 5  . azelastine (OPTIVAR) 0.05 % ophthalmic solution Place 1 drop into both eyes 2 (two) times daily as needed (itchy/watery eyes). 6 mL 5  . cetirizine (ZYRTEC ALLERGY) 10 MG tablet Take 1 tablet (10 mg total) by mouth 2 (two) times daily. 60 tablet 5  . fluticasone (FLONASE) 50 MCG/ACT nasal spray Place 1 spray into both nostrils in the morning and at bedtime. For nasal congestion 16 g 5  . medroxyPROGESTERone (DEPO-PROVERA) 150 MG/ML injection Inject 150 mg into the muscle every 3 (three) months.    . montelukast (SINGULAIR) 10 MG tablet Take 1 tablet (10 mg total) by mouth at bedtime. 30 tablet 5   No current facility-administered medications for this visit.   Allergies: No Known Allergies Social History: Social History   Socioeconomic History  . Marital status: Single    Spouse name: Not on file  . Number of children: Not on file  . Years of education: Not on file  . Highest education level: Not on file  Occupational History  . Not on file  Tobacco Use  . Smoking status: Never Smoker  . Smokeless tobacco: Never Used  Substance and Sexual Activity  . Alcohol use: No  . Drug use: No  .  Sexual activity: Yes    Birth control/protection: None  Other Topics Concern  . Not on file  Social History Narrative  . Not on file   Social Determinants of Health   Financial Resource Strain: Not on file  Food Insecurity: No Food Insecurity  .  Worried About Programme researcher, broadcasting/film/video in the Last Year: Never true  . Ran Out of Food in the Last Year: Never true  Transportation Needs: No Transportation Needs  . Lack of Transportation (Medical): No  . Lack of Transportation (Non-Medical): No  Physical Activity: Not on file  Stress: Not on file  Social Connections: Not on file   Lives in a house. 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 Heart attack Mother   . Hypertension Paternal Grandmother   . Diabetes Paternal Grandmother   . Allergic rhinitis Paternal Grandmother    Review of Systems  Constitutional: Negative for appetite change, chills, fever and unexpected weight change.  HENT: Positive for congestion, postnasal drip, rhinorrhea and sneezing.   Eyes: Positive for itching.  Respiratory: Negative for cough, chest tightness, shortness of breath and wheezing.   Cardiovascular: Negative for chest pain.  Gastrointestinal: Negative for abdominal pain.  Genitourinary: Negative for difficulty urinating.  Skin: Negative for rash.  Allergic/Immunologic: Positive for environmental allergies.  Neurological: Negative for headaches.   Objective: BP 112/66   Pulse 97   Temp 98.6 F (37 C)   Resp 14   Ht 5\' 4"  (1.626 m)   Wt 255 lb 3.2 oz (115.8 kg)   SpO2 97%   BMI 43.80 kg/m  Body mass index is 43.8 kg/m. Physical Exam Vitals and nursing note reviewed.  Constitutional:      Appearance: Normal appearance. She is well-developed.  HENT:     Head: Normocephalic and atraumatic.     Right Ear: External ear normal.     Left Ear: External ear normal.     Nose: Nose normal.     Mouth/Throat:     Mouth: Mucous membranes are moist.     Pharynx: Oropharynx is  clear.  Eyes:     Conjunctiva/sclera: Conjunctivae normal.  Cardiovascular:     Rate and Rhythm: Normal rate and regular rhythm.     Heart sounds: Normal heart sounds. No murmur heard. No friction rub. No gallop.   Pulmonary:     Effort: Pulmonary effort is normal.     Breath sounds: Normal breath sounds. No wheezing, rhonchi or rales.  Abdominal:     Palpations: Abdomen is soft.  Musculoskeletal:     Cervical back: Neck supple.  Skin:    General: Skin is warm.     Findings: No rash.  Neurological:     Mental Status: She is alert and oriented to person, place, and time.  Psychiatric:        Behavior: Behavior normal.    The plan was reviewed with the patient/family, and all questions/concerned were addressed.  It was my pleasure to see Ernestene today and participate in her care. Please feel free to contact me with any questions or concerns.  Sincerely,  Maria Bradford, DO Allergy & Immunology  [facility] office: [phone] [facility] office: [phone]

## 2020-08-23 ENCOUNTER — Other Ambulatory Visit: Payer: Self-pay | Admitting: *Deleted

## 2020-08-23 MED ORDER — AZELASTINE HCL 0.05 % OP SOLN: 1.0000 [drp] | Freq: Two times a day (BID) | OPHTHALMIC | 1 refills | Status: DC | PRN

## 2020-10-01 ENCOUNTER — Telehealth: Payer: Self-pay | Admitting: Lactation Services

## 2020-10-01 NOTE — Telephone Encounter (Signed)
Patient called with concerns of cramping and scant bleeding. She would like to know if she can get her Depo prior to May 11.   Returned patients call. Informed her that some women do experience some breakthrough bleeding when about time for next injection and it would not be considered abnormal. Reviewed the first day of her next dosage is May 11. Patient voiced understanding and just wanted to know if bleeding or period can be normal as she usually does not have a cycle after starting Depo.   Patient to call with any further questions or concerns as needed.

## 2020-10-16 ENCOUNTER — Ambulatory Visit (INDEPENDENT_AMBULATORY_CARE_PROVIDER_SITE_OTHER): Payer: Medicaid Other

## 2020-10-16 ENCOUNTER — Other Ambulatory Visit: Payer: Self-pay

## 2020-10-16 VITALS — BP 106/69 | HR 89 | Wt 252.5 lb

## 2020-10-16 DIAGNOSIS — Z3042 Encounter for surveillance of injectable contraceptive: Secondary | ICD-10-CM

## 2020-10-16 MED ORDER — MEDROXYPROGESTERONE ACETATE 150 MG/ML IM SUSP
150.0000 mg | Freq: Once | INTRAMUSCULAR | Status: AC
  Administered 2020-10-16: 150 mg via INTRAMUSCULAR

## 2020-10-16 NOTE — Progress Notes (Signed)
[facility] here for Depo-Provera Injection. Injection administered without complication. Patient will return in 3 months for next injection between July 25 and Aug 10. Next annual visit due 03/2021.   Ralene Bathe, RN 10/16/2020  9:26 AM

## 2020-10-21 ENCOUNTER — Ambulatory Visit: Payer: Medicaid Other | Admitting: Allergy

## 2020-10-21 NOTE — Progress Notes (Deleted)
Follow Up Note  RE: Maria Bruce MRN: [id number] DOB: Aug 08, 1990 Date of Office Visit: 10/21/2020  Referring provider: Inez Catalina, MD Primary care provider: Inez Catalina, MD  Chief Complaint: No chief complaint on file.  History of Present Illness: I had the pleasure of seeing Maria Bruce for a follow up visit at the [facility] on 10/21/2020. She is a 30 y.o. female, who is being followed for allergic rhinoconjunctivitis and history of asthma. Her previous allergy office visit was on 08/21/2020 with Dr. Selena Batten. Today is a regular follow up visit.  Other allergic rhinitis Perennial rhinoconjunctivitis symptoms for the last 2+ years since she moved to [place].  Worse during the spring and summer.  Tried Allegra, Claritin, Flonase and over-the-counter eyedrops with some benefit.  No prior allergy/ENT evaluation.  3 cats at home.  Today's skin testing showed: Positive to grass pollen, weed pollen, tree pollen, mold, cockroach, mouse, tobacco, cat, dust mites. Borderline positive to ragweed pollen.   Start environmental control measures as below.  May use over the counter antihistamines such as Zyrtec (cetirizine), Claritin (loratadine), Allegra (fexofenadine), or Xyzal (levocetirizine) daily as needed. May take twice a day during flares.  May use Flonase (fluticasone) nasal spray 1 spray per nostril twice a day as needed for nasal congestion.   May use azelastine nasal spray 1-2 sprays per nostril twice a day as needed for runny nose/drainage.  Start Singulair (montelukast) 10mg  daily at night.  Cautioned that in some children/adults can experience behavioral changes including hyperactivity, agitation, depression, sleep disturbances and suicidal ideations. These side effects are rare, but if you notice them you should notify me and discontinue Singulair (montelukast).  May use Optivar (azelastine) eye drops 0.05% twice a day as needed  for itchy/watery eyes.  Read about allergy injections - handout given.   Allergic conjunctivitis of both eyes  See assessment and plan as above for allergic rhinitis.  History of asthma History of asthma as a child. No inhaler use recently.  Today's spirometry was normal.  Monitor symptoms.    Assessment and Plan: Maria Bruce is a 30 y.o. female with: No problem-specific Assessment & Plan notes found for this encounter.  No follow-ups on file.  No orders of the defined types were placed in this encounter.  Lab Orders  No laboratory test(s) ordered today    Diagnostics: Spirometry:  Tracings reviewed. Her effort: {Blank single:19197::"Good reproducible efforts.","It was hard to get consistent efforts and there is a question as to whether this reflects a maximal maneuver.","Poor effort, data can not be interpreted."} FVC: ***L FEV1: ***L, ***% predicted FEV1/FVC ratio: ***% Interpretation: {Blank single:19197::"Spirometry consistent with mild obstructive disease","Spirometry consistent with moderate obstructive disease","Spirometry consistent with severe obstructive disease","Spirometry consistent with possible restrictive disease","Spirometry consistent with mixed obstructive and restrictive disease","Spirometry uninterpretable due to technique","Spirometry consistent with normal pattern","No overt abnormalities noted given today's efforts"}.  Please see scanned spirometry results for details.  Skin Testing: {Blank single:19197::"Select foods","Environmental allergy panel","Environmental allergy panel and select foods","Food allergy panel","None","Deferred due to recent antihistamines use"}. Positive test to: ***. Negative test to: ***.  Results discussed with patient/family.   Medication List:  Current Outpatient Medications  Medication Sig Dispense Refill  . azelastine (ASTELIN) 0.1 % nasal spray Place 1-2 sprays into both nostrils 2 (two) times daily as needed (runny  nose). Use in each nostril as directed (Patient not taking: Reported on 10/16/2020) 30 mL 5  . azelastine (OPTIVAR) 0.05 % ophthalmic solution Place  1 drop into both eyes 2 (two) times daily as needed (itchy/watery eyes). (Patient not taking: Reported on 10/16/2020) 6 mL 5  . azelastine (OPTIVAR) 0.05 % ophthalmic solution Place 1 drop into both eyes 2 (two) times daily as needed. (Patient not taking: Reported on 10/16/2020) 18 mL 1  . cetirizine (ZYRTEC ALLERGY) 10 MG tablet Take 1 tablet (10 mg total) by mouth 2 (two) times daily. (Patient not taking: Reported on 10/16/2020) 60 tablet 5  . fluticasone (FLONASE) 50 MCG/ACT nasal spray Place 1 spray into both nostrils in the morning and at bedtime. For nasal congestion (Patient not taking: Reported on 10/16/2020) 16 g 5  . medroxyPROGESTERone (DEPO-PROVERA) 150 MG/ML injection Inject 150 mg into the muscle every 3 (three) months.    . montelukast (SINGULAIR) 10 MG tablet Take 1 tablet (10 mg total) by mouth at bedtime. 30 tablet 5   No current facility-administered medications for this visit.   Allergies: No Known Allergies I reviewed her past medical history, social history, family history, and environmental history and no significant changes have been reported from her previous visit.  Review of Systems  Constitutional: Negative for appetite change, chills, fever and unexpected weight change.  HENT: Positive for congestion, postnasal drip, rhinorrhea and sneezing.   Eyes: Positive for itching.  Respiratory: Negative for cough, chest tightness, shortness of breath and wheezing.   Cardiovascular: Negative for chest pain.  Gastrointestinal: Negative for abdominal pain.  Genitourinary: Negative for difficulty urinating.  Skin: Negative for rash.  Allergic/Immunologic: Positive for environmental allergies.  Neurological: Negative for headaches.   Objective: There were no vitals taken for this visit. There is no height or weight on file to  calculate BMI. Physical Exam Vitals and nursing note reviewed.  Constitutional:      Appearance: Normal appearance. She is well-developed.  HENT:     Head: Normocephalic and atraumatic.     Right Ear: External ear normal.     Left Ear: External ear normal.     Nose: Nose normal.     Mouth/Throat:     Mouth: Mucous membranes are moist.     Pharynx: Oropharynx is clear.  Eyes:     Conjunctiva/sclera: Conjunctivae normal.  Cardiovascular:     Rate and Rhythm: Normal rate and regular rhythm.     Heart sounds: Normal heart sounds. No murmur heard. No friction rub. No gallop.   Pulmonary:     Effort: Pulmonary effort is normal.     Breath sounds: Normal breath sounds. No wheezing, rhonchi or rales.  Abdominal:     Palpations: Abdomen is soft.  Musculoskeletal:     Cervical back: Neck supple.  Skin:    General: Skin is warm.     Findings: No rash.  Neurological:     Mental Status: She is alert and oriented to person, place, and time.  Psychiatric:        Behavior: Behavior normal.    Previous notes and tests were reviewed. The plan was reviewed with the patient/family, and all questions/concerned were addressed.  It was my pleasure to see Tynia today and participate in her care. Please feel free to contact me with any questions or concerns.  Sincerely,  Wyline Mood, DO Allergy & Immunology  [facility] office: [phone] [facility] office: [phone]

## 2020-11-15 ENCOUNTER — Encounter: Payer: Self-pay | Admitting: *Deleted

## 2020-12-10 ENCOUNTER — Encounter: Payer: Self-pay | Admitting: *Deleted

## 2020-12-17 DIAGNOSIS — F4322 Adjustment disorder with anxiety: Secondary | ICD-10-CM | POA: Diagnosis not present

## 2021-01-02 ENCOUNTER — Other Ambulatory Visit: Payer: Self-pay

## 2021-01-02 ENCOUNTER — Ambulatory Visit (INDEPENDENT_AMBULATORY_CARE_PROVIDER_SITE_OTHER): Payer: Medicaid Other | Admitting: General Practice

## 2021-01-02 VITALS — BP 103/71 | HR 78 | Ht 64.0 in | Wt 249.0 lb

## 2021-01-02 DIAGNOSIS — Z3042 Encounter for surveillance of injectable contraceptive: Secondary | ICD-10-CM | POA: Diagnosis not present

## 2021-01-02 MED ORDER — MEDROXYPROGESTERONE ACETATE 150 MG/ML IM SUSP
150.0000 mg | Freq: Once | INTRAMUSCULAR | Status: AC
  Administered 2021-01-02: 150 mg via INTRAMUSCULAR

## 2021-01-02 NOTE — Progress Notes (Signed)
[facility] here for Depo-Provera Injection. Injection administered without complication. Patient will return in 3 months for next injection between Oct 13 and Oct 27. Next annual visit due October 2022.   Marylynn Pearson, RN 01/02/2021  9:43 AM

## 2021-01-02 NOTE — Progress Notes (Signed)
Chart reviewed for nurse visit. Agree with plan of care.    Camelia Eng, MSN, CNM 01/02/2021 1:12 PM

## 2021-04-15 ENCOUNTER — Ambulatory Visit: Payer: Medicaid Other

## 2021-04-18 ENCOUNTER — Ambulatory Visit: Payer: Medicaid Other

## 2021-05-12 ENCOUNTER — Other Ambulatory Visit: Payer: Self-pay

## 2021-05-12 ENCOUNTER — Ambulatory Visit (INDEPENDENT_AMBULATORY_CARE_PROVIDER_SITE_OTHER): Payer: Medicaid Other | Admitting: Internal Medicine

## 2021-05-12 ENCOUNTER — Encounter: Payer: Self-pay | Admitting: Internal Medicine

## 2021-05-12 VITALS — BP 125/76 | HR 98 | Temp 98.2°F | Ht 64.0 in | Wt 248.9 lb

## 2021-05-12 DIAGNOSIS — J3089 Other allergic rhinitis: Secondary | ICD-10-CM | POA: Diagnosis not present

## 2021-05-12 DIAGNOSIS — Z0000 Encounter for general adult medical examination without abnormal findings: Secondary | ICD-10-CM

## 2021-05-12 DIAGNOSIS — M545 Low back pain, unspecified: Secondary | ICD-10-CM

## 2021-05-12 DIAGNOSIS — R7303 Prediabetes: Secondary | ICD-10-CM | POA: Diagnosis not present

## 2021-05-12 DIAGNOSIS — G8929 Other chronic pain: Secondary | ICD-10-CM | POA: Diagnosis not present

## 2021-05-12 DIAGNOSIS — Z131 Encounter for screening for diabetes mellitus: Secondary | ICD-10-CM | POA: Diagnosis present

## 2021-05-12 LAB — POCT GLYCOSYLATED HEMOGLOBIN (HGB A1C): Hemoglobin A1C: 5.7 % — AB (ref 4.0–5.6)

## 2021-05-12 LAB — GLUCOSE, CAPILLARY: Glucose-Capillary: 106 mg/dL — ABNORMAL HIGH (ref 70–99)

## 2021-05-12 NOTE — Assessment & Plan Note (Signed)
Complaining of allergic rhinitis flare in setting of intermittent medication compliance. She is complaining of rhinorrhea and watery eyes x days to weeks. She was seen by Dr Selena Batten, allergy, in march and started on a regimen including Zyrtec, Flonase, azelastine, Singulair, and azelastine eye drops; she never started using the eye drops because of cost. She was advised to think about injections for allergy but has not followed up with allergy since initial visit. Advised pt to start daily adherence to medication regimen and to fu with Allergy and asthma center/ Dr Selena Batten for injections. She is agreeable with plan.   Continue medication regimen per Allergy/ Dr Selena Batten F/u with Dr Selena Batten for injections, contact number provided

## 2021-05-12 NOTE — Assessment & Plan Note (Deleted)
Pt with strong FHx of DM and BMI > 40, at risk for pre-diabetes or DM. A1c today, will f/u and start metformin therapy if indicated.

## 2021-05-12 NOTE — Assessment & Plan Note (Addendum)
Pt with strong FHx of DM and BMI > 40, and CBG 106, at risk DM. Obtained POC A1c, resulting 5.7. Discussed lifestyle modifications including diet, daily exercise, and weight loss.  Recheck A1c in 6 mo, and consider metformin therapy if appropriate at that time.

## 2021-05-12 NOTE — Progress Notes (Signed)
CC: back pain and allergic rhinitis f/u since last OV in March 2022  HPI:  [name] is a 30 y.o. female with a PMHx stated below and presents today for stated above. Please see the Encounters tab for problem-based Assessment & Plan for additional details.   Past Medical History:  Diagnosis Date   Asthma    as a child    Current Outpatient Medications on File Prior to Visit  Medication Sig Dispense Refill   azelastine (ASTELIN) 0.1 % nasal spray Place 1-2 sprays into both nostrils 2 (two) times daily as needed (runny nose). Use in each nostril as directed (Patient not taking: Reported on 10/16/2020) 30 mL 5   azelastine (OPTIVAR) 0.05 % ophthalmic solution Place 1 drop into both eyes 2 (two) times daily as needed (itchy/watery eyes). (Patient not taking: Reported on 10/16/2020) 6 mL 5   azelastine (OPTIVAR) 0.05 % ophthalmic solution Place 1 drop into both eyes 2 (two) times daily as needed. (Patient not taking: Reported on 10/16/2020) 18 mL 1   cetirizine (ZYRTEC ALLERGY) 10 MG tablet Take 1 tablet (10 mg total) by mouth 2 (two) times daily. (Patient not taking: Reported on 10/16/2020) 60 tablet 5   fluticasone (FLONASE) 50 MCG/ACT nasal spray Place 1 spray into both nostrils in the morning and at bedtime. For nasal congestion (Patient not taking: Reported on 10/16/2020) 16 g 5   medroxyPROGESTERone (DEPO-PROVERA) 150 MG/ML injection Inject 150 mg into the muscle every 3 (three) months.     montelukast (SINGULAIR) 10 MG tablet Take 1 tablet (10 mg total) by mouth at bedtime. 30 tablet 5   No current facility-administered medications on file prior to visit.    Family History  Problem Relation Age of Onset   Heart attack Mother    Hypertension Paternal Grandmother    Diabetes Paternal Grandmother    Allergic rhinitis Paternal Grandmother     Social History   Socioeconomic History   Marital status: Single    Spouse name: Not on file   Number of children: Not on  file   Years of education: Not on file   Highest education level: Not on file  Occupational History   Not on file  Tobacco Use   Smoking status: Never   Smokeless tobacco: Never  Substance and Sexual Activity   Alcohol use: No   Drug use: No   Sexual activity: Yes    Birth control/protection: None  Other Topics Concern   Not on file  Social History Narrative   Not on file   Social Determinants of Health   Financial Resource Strain: Not on file  Food Insecurity: No Food Insecurity   Worried About Programme researcher, broadcasting/film/video in the Last Year: Never true   Ran Out of Food in the Last Year: Never true  Transportation Needs: Unmet Transportation Needs   Lack of Transportation (Medical): Yes   Lack of Transportation (Non-Medical): Yes  Physical Activity: Not on file  Stress: Not on file  Social Connections: Not on file  Intimate Partner Violence: Not on file    Review of Systems: ROS negative except for what is noted on the assessment and plan.  There were no vitals filed for this visit.   Physical Exam: Constitutional: alert, well-appearing, in NAD HENT: normocephalic, atraumatic, mucous membranes moist Eyes: conjunctiva  mildly erythematous, EOMI Cardiovascular: RRR, no m/r/g, non-edematous bilateral LE Pulmonary/Chest: normal work of breathing on RA, LCTAB Abdominal: soft, non-tender to palpation, non-distended MSK: normal bulk  and tone, left sided para-spinal tenderness unchanged from prior visit. No TTP along spine. No pain radiating down legs.  Neurological: A&O x 3 Skin: warm and dry  Psych: normal behavior, normal affect    Assessment & Plan:   See Encounters Tab for problem based charting.  Patient seen with Dr. Casimer Bilis, MD  Internal Medicine Resident, PGY-1 [facility]

## 2021-05-12 NOTE — Patient Instructions (Signed)
Thank you, Maria Bruce Plan for allowing [place] to provide your care today!  Today we discussed:  Diabetes: We got an A1c today. I will call you with this result.   Chronic pain: Please continue to use lidocaine cream, tylenol as needed, heat and cold therapy, and physical therapy.   Allergies: Please continue using the medications prescribed and instructed by the allergy specialist. Also make an appointment with the specialist by calling [facility], Dr Selena Batten ar [phone] for injections for allergies.   May use over the counter antihistamines such as Zyrtec (cetirizine), Claritin (loratadine), Allegra (fexofenadine), or Xyzal (levocetirizine) daily as needed. May take twice a day during flares. May use Flonase (fluticasone) nasal spray 1 spray per nostril twice a day as needed for nasal congestion.  May use azelastine nasal spray 1-2 sprays per nostril twice a day as needed for runny nose/drainage. Start Singulair (montelukast) 10mg  daily at night. Cautioned that in some children/adults can experience behavioral changes including hyperactivity, agitation, depression, sleep disturbances and suicidal ideations. These side effects are rare, but if you notice them you should notify me and discontinue Singulair (montelukast). May use Optivar (azelastine) eye drops 0.05% twice a day as needed for itchy/watery eyes. Read about allergy injections - handout given.   I will call you if any labs, tests, or imaging results require any further attention or action.   I have ordered the following labs for you:  Lab Orders         POC Hbg A1C        Referrals ordered today:   Referral Orders         Ambulatory referral to Physical Therapy       Medications ordered or changed:  Stop the following medications: There are no discontinued medications.   Start the following medications: No orders of the defined types were placed in this encounter.    Follow up in: 3 mo     Should you have any questions or concerns please call the internal medicine clinic at [phone].     [phone], MD  Internal Medicine Resident, PGY-1 [facility]

## 2021-05-12 NOTE — Assessment & Plan Note (Signed)
Stable chronic left-sided low back pain compared to prior visit in March. No recent injuries or falls. Trigger remains bending or when her kids accidentally touch the area. No red flags on exam. Vitals stable. During prior visit, she was advised to use topical lidocaine cream prn for relief; she states that it is not helpful. She is educated on the tx options including topical therapy, tylenol prn, short course of NSAIDs, hot and cold therapy, and PT. Given minimal referral since March, will place PT referral. She expresses understanding.  Continue topical lidocaine  Tylenol 1000 mg tid prn  NSAIDs prn Thermotherapy  Physical therapy referral placed

## 2021-05-15 ENCOUNTER — Ambulatory Visit: Payer: Medicaid Other

## 2021-05-20 ENCOUNTER — Ambulatory Visit (INDEPENDENT_AMBULATORY_CARE_PROVIDER_SITE_OTHER): Payer: Medicaid Other

## 2021-05-20 ENCOUNTER — Other Ambulatory Visit: Payer: Self-pay

## 2021-05-20 VITALS — BP 124/60 | HR 60 | Wt 249.4 lb

## 2021-05-20 DIAGNOSIS — Z3042 Encounter for surveillance of injectable contraceptive: Secondary | ICD-10-CM

## 2021-05-20 NOTE — Progress Notes (Signed)
[facility] here for Depo-Provera Injection. Last injection was given 01/02/21. Patient was due for annual exam September 2022. Crisoforo Oxford, CNM agrees to see patient within the next hour if she would be willing to wait. Patient states she cannot stay due to needing to pick up child. Offered appt tomorrow, pt agrees. When patient went to check out at front office appt slot no longer available. Scheduled annual visit for 06/23/21.  Marjo Bicker, RN 05/20/2021  9:05 AM

## 2021-05-20 NOTE — Progress Notes (Signed)
Internal Medicine Clinic Attending  I saw and evaluated the patient.  I personally confirmed the key portions of the history and exam documented by Dr. Patel and I reviewed pertinent patient test results.  The assessment, diagnosis, and plan were formulated together and I agree with the documentation in the resident's note.  

## 2021-05-24 NOTE — Therapy (Incomplete)
OUTPATIENT PHYSICAL THERAPY THORACOLUMBAR EVALUATION   Patient Name: Maria Bruce MRN: [id number] DOB:[birth year], 30 y.o., female Today's Date: 05/24/2021    Past Medical History:  Diagnosis Date   Asthma    as a child   Past Surgical History:  Procedure Laterality Date   NO PAST SURGERIES     Patient Active Problem List   Diagnosis Date Noted   Healthcare maintenance 05/12/2021   Prediabetes 05/12/2021   Other allergic rhinitis 08/21/2020   Allergic conjunctivitis of both eyes 08/21/2020   History of asthma 08/21/2020   Chronic nasal congestion 07/05/2020   Difficulty sleeping 07/05/2020   Chronic left-sided low back pain without sciatica 07/05/2020   BMI 40.0-44.9, adult (HCC) 11/02/2019   Anemia in pregnancy, third trimester 11/10/2017    PCP: [facility], MD  REFERRING PROVIDER: Reymundo Poll, MD  REFERRING DIAG: Chronic left-sided low back pain without sciatica [M54.50, G89.29]  THERAPY DIAG:  No diagnosis found.  ONSET DATE: ***  SUBJECTIVE:                                                                                                                                                                                           Maria Bruce is a 30 y.o. female who presents to clinic with chief complaint of ***.  MOI/History of condition:  ***  From referring provider: "Stable chronic left-sided low back pain compared to prior visit in March. No recent injuries or falls. Trigger remains bending or when her kids accidentally touch the area. No red flags on exam. Vitals stable. During prior visit, she was advised to use topical lidocaine cream prn for relief; she states that it is not helpful. She is educated on the tx options including topical therapy, tylenol prn, short course of NSAIDs, hot and cold therapy, and PT. Given minimal referral since March, will place PT referral. She expresses understanding"   Red flags:   {has/denies:26543} {kerredflag:26542}  Pertinent past history:  ***  Pain:  Are you having pain? {yes/no:20286} Pain location: *** NPRS scale:  highest {NUMBERS; 0-10:5044}/10 current {NUMBERS; 0-10:5044}/10  best {NUMBERS; 0-10:5044}/10 Aggravating factors: *** Relieving factors: *** Pain description: {PAIN DESCRIPTION:21022940} Severity: {Desc; low/moderate/high:110033} Irritability: {Desc; low/moderate/high:110033} Stage: {Desc; acute/subacute/chronic:13799} Stability: {kerbetterworse:26715} 24 hour pattern: ***   Occupation: ***  Hobbies/Recreation: ***  Assistive Device: ***  Hand Dominance: ***  Patient Goals ***   PRECAUTIONS: {Therapy precautions:24002}  WEIGHT BEARING RESTRICTIONS {Yes ***/No:24003}  FALLS:  Has patient fallen in last 6 months? {yes/no:20286}, Number of falls: ***  LIVING ENVIRONMENT: Lives with: {[facility] lives with:25569::"lives with their family"} Stairs: {yes/no:20286}; {Stairs:24000}  PLOF: {PLOF:24004}  DIAGNOSTIC FINDINGS:  ***  OBJECTIVE:   GENERAL OBSERVATION:  ***  SENSATION:  Light touch: {intact/deficits:24005}  MUSCLE LENGTH: Hamstrings: Right *** deg; Left *** deg Maisie Fus test: Right (***); Left (***)   LUMBAR AROM  AROM AROM  05/24/2021  Flexion {kerromcxlx:26716}  Extension {kerromcxlx:26716}  Right lateral flexion {kerromcxlx:26716}  Left lateral flexion {kerromcxlx:26716}  Right rotation {kerromcxlx:26716}  Left rotation {kerromcxlx:26716}   (Blank rows = not tested)  LE ROM:  ROM Right 05/24/2021 Left 05/24/2021  Hip flexion    Hip extension    Hip abduction    Hip adduction    Hip internal rotation    Hip external rotation    Knee flexion    Knee extension    Ankle dorsiflexion    Ankle plantarflexion    Ankle inversion    Ankle eversion     (Blank rows = not tested)  LE MMT:  MMT Right 05/24/2021 Left 05/24/2021  Hip flexion    Knee extension    Knee flexion    Hip  abduction    Hip extension    Hip external rotation    Hip internal rotation    Hip adduction    Ankle dorsiflexion    Ankle plantarflexion    Ankle inversion    Ankle eversion     (Blank rows = not tested; all scores listed out of a possible 5)   LUMBAR SPECIAL TESTS:  Straight leg raise: L (***), R (***) Slump: L (***), R (***)  Palpation:   ***  SPINAL SEGMENTAL MOBILITY ASSESSMENT:  ***  FUNCTIONAL TESTS:  ***  GAIT: ***  PATIENT SURVEYS:  {rehab surveys:24030}    TODAY'S TREATMENT  ***  PATIENT EDUCATION:  POC, diagnosis, prognosis, HEP, and outcome measures.  Pt educated via explanation, demonstration, and handout (HEP).  Pt confirms understanding verbally.    HOME EXERCISE PROGRAM: ***  ASSESSMENT:  CLINICAL IMPRESSION: Maria Bruce is a 30 y.o. female who presents to clinic with signs and sxs consistent with ***.  Patient presents with pain and impairments/deficits in: ***.  Activity limitations include: ***.  Participation limitations include: ***.  Patient will benefit from skilled therapy to address pain and the listed deficits in order to achieve functional goals, enable safety and independence in completion of daily tasks, and return to PLOF.   REHAB POTENTIAL: {rehabpotential:25112}  CLINICAL DECISION MAKING: {clinical decision making:25114}  EVALUATION COMPLEXITY: {Evaluation complexity:25115}   GOALS: Goals reviewed with patient? Yes  SHORT TERM GOALS:  STG Name Target Date Goal status  1 Maria Bruce will be >75% HEP compliant to improve carryover between sessions and facilitate independent management of condition  Baseline: No HEP 06/14/2021 INITIAL   LONG TERM GOALS:   LTG Name Target Date Goal status  1 *** 07/19/2021 INITIAL  2 *** 07/19/2021 INITIAL  3 *** 07/19/2021 INITIAL  4 *** 07/19/2021 INITIAL  5 *** 07/19/2021 INITIAL  6 *** 07/19/2021 INITIAL  7 *** 07/19/2021 INITIAL   PLAN: PT FREQUENCY: 1-2x/week  PT DURATION: 8 weeks  (Ending 07/19/2021)  PLANNED INTERVENTIONS: Therapeutic exercises, Therapeutic activity, Neuro Muscular re-education, Gait training, Patient/Family education, Joint mobilization, Dry Needling, Electrical stimulation, Spinal mobilization and/or manipulation, Moist heat, Taping, Vasopneumatic device, Ionotophoresis 4mg /ml Dexamethasone, and Manual therapy  PLAN FOR NEXT SESSION: ***   PT, DPT 05/24/2021, 9:28 AM

## 2021-05-27 ENCOUNTER — Ambulatory Visit: Payer: Medicaid Other | Admitting: Physical Therapy

## 2021-06-10 ENCOUNTER — Other Ambulatory Visit: Payer: Self-pay

## 2021-06-10 ENCOUNTER — Encounter: Payer: Self-pay | Admitting: Physical Therapy

## 2021-06-10 ENCOUNTER — Ambulatory Visit: Payer: Medicaid Other | Attending: Internal Medicine | Admitting: Physical Therapy

## 2021-06-10 DIAGNOSIS — G8929 Other chronic pain: Secondary | ICD-10-CM | POA: Diagnosis present

## 2021-06-10 DIAGNOSIS — M545 Low back pain, unspecified: Secondary | ICD-10-CM | POA: Diagnosis present

## 2021-06-10 DIAGNOSIS — M6281 Muscle weakness (generalized): Secondary | ICD-10-CM

## 2021-06-10 NOTE — Patient Instructions (Signed)
Access Code: [email or web address] URL: [email or web address] Date: 06/10/2021 Prepared by: Rosana Hoes  Exercises Straight Leg Raise - 1 x daily - 7 x weekly - 2 sets - 10 reps Bridge - 1 x daily - 7 x weekly - 2 sets - 10 reps - 3 seconds hold Clam with Resistance - 1 x daily - 7 x weekly - 2 sets - 15 reps
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
## 2021-06-11 NOTE — Progress Notes (Signed)
Chart reviewed for nurse visit. Agree with plan of care.  ° °Yue Glasheen Lorraine, CNM °06/11/2021 4:05 PM  ° °

## 2021-06-17 ENCOUNTER — Encounter: Payer: Self-pay | Admitting: Physical Therapy

## 2021-06-17 ENCOUNTER — Other Ambulatory Visit: Payer: Self-pay

## 2021-06-17 ENCOUNTER — Ambulatory Visit: Payer: Medicaid Other | Admitting: Physical Therapy

## 2021-06-17 DIAGNOSIS — G8929 Other chronic pain: Secondary | ICD-10-CM

## 2021-06-17 DIAGNOSIS — M545 Low back pain, unspecified: Secondary | ICD-10-CM

## 2021-06-17 DIAGNOSIS — M6281 Muscle weakness (generalized): Secondary | ICD-10-CM

## 2021-06-17 NOTE — Therapy (Signed)
OUTPATIENT PHYSICAL THERAPY TREATMENT NOTE   Patient Name: Maria Bruce MRN: [id number] DOB:08/24/1990, 31 y.o., female Today's Date: 06/17/2021  PCP: Carmel Sacramento, MD REFERRING PROVIDER: Reymundo Poll, MD   PT End of Session - 06/17/21 1003     Visit Number 2    Number of Visits 8    Date for PT Re-Evaluation 08/05/21    Authorization Type [employer]    Authorization - Number of Visits 27    PT Start Time 1003    PT Stop Time 1042    PT Time Calculation (min) 39 min    Activity Tolerance Patient tolerated treatment well    Behavior During Therapy WFL for tasks assessed/performed             Past Medical History:  Diagnosis Date   Asthma    as a child   Past Surgical History:  Procedure Laterality Date   NO PAST SURGERIES     Patient Active Problem List   Diagnosis Date Noted   Healthcare maintenance 05/12/2021   Prediabetes 05/12/2021   Other allergic rhinitis 08/21/2020   Allergic conjunctivitis of both eyes 08/21/2020   History of asthma 08/21/2020   Chronic nasal congestion 07/05/2020   Difficulty sleeping 07/05/2020   Chronic left-sided low back pain without sciatica 07/05/2020   BMI 40.0-44.9, adult (HCC) 11/02/2019   Anemia in pregnancy, third trimester 11/10/2017    REFERRING PROVIDER: Reymundo Poll, MD   REFERRING DIAG: Chronic left-sided low back pain without sciatica  THERAPY DIAG:  Chronic left-sided low back pain, unspecified whether sciatica present  Muscle weakness (generalized)  ONSET DATE: worsening past 3 years  PERTINENT HISTORY: None  PRECAUTIONS: None WEIGHT BEARING RESTRICTIONS: No  SUBJECTIVE: Patient reports she is feeling better, she has been consistent with her exercises at home. No other new issues.   PAIN:  Are you having pain? No VAS scale: 0/10 (8/10 at worst) Pain location: Lower back Pain orientation: Left  PAIN TYPE: Chronic Pain description: intermittent, burning Aggravating factors:  Walking, standing extended periods, lifting heavy or constantly Relieving factors: Heating pad, muscle cream   OBJECTIVE: (BOLDED MEASURES ASSESSED THIS VISIT) PATIENT SURVEYS:  Modified Oswestry 8/50    LUMBAR AROM   AROM AROM  06/10/2021  06/17/2021  Flexion WFL   Extension WFL   Right lateral flexion WFL   Left lateral flexion WFL   Right rotation WFL   Left rotation WFL - mild left sided discomfort WFL - patient reports no discomfort   LE MMT:                        Core strength grossly 3/5 MMT   MMT Right 06/10/2021 Left 06/10/2021  Hip flexion 4+ 4+  Hip extension 4 4-  Hip abduction 4 4  Knee flexion 5 5  Knee extension 5 5   (Blank rows = not tested)   FUNCTIONAL TESTS:  Lifting: patient with poor lifting mechanics, increased spinal flexion and poor lumbopelvic control     TODAY'S TREATMENT:   06/17/2021: Recumbent bike L2 x 5 min while taking subjective Supine lumbar rotation stretch x 3 with 5 sec hold alternating SLR with focus on core activation x 10 each Bridge 2 x 10 with 3 sec hold 90-90 abdominal isometrics 2 x 6 with 3 sec hold - lifting/lowering 1 leg at a time while maintaining lumbopelvic control Side clamshell with green x 15 each Sit to stand holding 10# at chest 2 x  10 - focus on maintaining core activation and hip hinge technique Pallof press with green 2 x 5 with 3 sec hold each Springboard pulldown with yellow springs 2 x 10 Sidelying hip abduction 2 x 10 each  06/10/2021: SLR with focus on core activation x 10 each Bridge 10 x 3 sec hold Side clamshell with green x 15 each     PATIENT EDUCATION:  Education details: HEP Person educated: Patient Education method: Programmer, multimedia, Demonstration, Actor cues, Verbal cues, and Handouts Education comprehension: verbalized understanding, returned demonstration, verbal cues required, tactile cues required, and needs further education   HOME EXERCISE PROGRAM: Access Code: [email or web address]      ASSESSMENT: CLINICAL IMPRESSION: Patient tolerated therapy well with no adverse effects. Therapy focused on progress core/hip strengthening with adequate lumbopelvic control. She was able to progress with exercise challenge and to standing strengthening with good tolerance. She did require consistent cueing for maintaining abdominal engagement to avoid excessive lumbar extension and she reported fatigue toward end of therapy. No changes to HEP this visit. Patient would benefit from continued skilled PT to progress her strength and control in order to reduce pain and maximize her functional ability.    GOALS: Goals reviewed with patient? Yes   SHORT TERM GOALS:   STG Name Target Date Goal status  1 Patient will be I with initial HEP in order to progress with therapy. Baseline: provided at eval 07/08/2021 INITIAL  2 Patient will report pain no more than 6/10 with all activity to reduce functional limitations. Baseline: pain up to 8/10 07/08/2021 INITIAL  3 Patient will demonstrate proper lifting mechanics to improve lumbopelvic control and reduce back pain Baseline: patient exhibits deviations with lifting mechanics 07/08/2021 INITIAL    LONG TERM GOALS:    LTG Name Target Date Goal status  1 Patient will be I with final HEP to maintain progress from PT. Baseline: provided at eval 08/05/2021 INITIAL  2 Patient will report improved functional ability with </= 3/50 modified oswestry Baseline: 08/05/2021 INITIAL  3 Patient will demonstrate core and hip strength >/= 4/5 MMT in order to reduce pain with lifting, standing and walking extended periods Baseline: 08/05/2021 INITIAL  4 Patient will report no more than 3/10 with all activity and standing tasks to reduce functional limitations Baseline: up to 8/10 pain 08/05/2021 INITIAL      PLAN: PT FREQUENCY: 1x/week   PT DURATION: 8 weeks   PLANNED INTERVENTIONS: Therapeutic exercises, Therapeutic activity, Neuro Muscular re-education, Balance  training, Gait training, Patient/Family education, Joint mobilization, Aquatic Therapy, Dry Needling, Electrical stimulation, Spinal mobilization, Cryotherapy, Moist heat, and Manual therapy   PLAN FOR NEXT SESSION: Review HEP and progress PRN, manual/dry needling for left lumbar paraspinals, focus on core strengthening and progress lifting mechanics    Rosana Hoes, PT, DPT, LAT, ATC 06/17/21  10:43 AM Phone: [phone] Fax: [phone]

## 2021-06-23 ENCOUNTER — Other Ambulatory Visit: Payer: Self-pay

## 2021-06-23 ENCOUNTER — Encounter: Payer: Self-pay | Admitting: Obstetrics and Gynecology

## 2021-06-23 ENCOUNTER — Ambulatory Visit (INDEPENDENT_AMBULATORY_CARE_PROVIDER_SITE_OTHER): Payer: Medicaid Other | Admitting: Obstetrics and Gynecology

## 2021-06-23 VITALS — BP 119/56 | HR 94 | Ht 64.0 in | Wt 251.2 lb

## 2021-06-23 DIAGNOSIS — Z3042 Encounter for surveillance of injectable contraceptive: Secondary | ICD-10-CM

## 2021-06-23 DIAGNOSIS — Z01419 Encounter for gynecological examination (general) (routine) without abnormal findings: Secondary | ICD-10-CM

## 2021-06-23 LAB — POCT PREGNANCY, URINE: Preg Test, Ur: NEGATIVE

## 2021-06-23 MED ORDER — MEDROXYPROGESTERONE ACETATE 150 MG/ML IM SUSP
150.0000 mg | Freq: Once | INTRAMUSCULAR | Status: AC
  Administered 2021-06-23: 150 mg via INTRAMUSCULAR

## 2021-06-23 NOTE — Progress Notes (Signed)
GYNECOLOGY ANNUAL PREVENTATIVE CARE ENCOUNTER NOTE  History:     Maria Bruce is a 31 y.o. [phone] female here for a routine annual gynecologic exam.    Current complaints: None.  She would like to continue her Depo but is considering other options.  She does not plan to have any more children but is not ready for anything permanent.   Denies abnormal vaginal bleeding, discharge, pelvic pain, problems with intercourse or other gynecologic concerns.  Gynecologic History No LMP recorded. Contraception: Depo-Provera injections Last Pap: 02/2020. Result was normal with normal HPV. Normal paps in 2018 and 2016 as well.    Obstetric History OB History  Gravida Para Term Preterm AB Living  6 6 6  0 0 6  SAB IAB Ectopic Multiple Live Births  0 0 0 0 6    # Outcome Date GA Lbr Len/2nd Weight Sex Delivery Anes PTL Lv  6 Term 12/07/17 [redacted]w[redacted]d 09:38 / 00:05 7 lb 9.3 oz (3.44 kg) F Vag-Spont EPI  LIV  5 Term 07/16/16 [redacted]w[redacted]d / 00:19 7 lb 3 oz (3.26 kg) F Vag-Spont EPI  LIV  4 Term 10/24/14 [redacted]w[redacted]d 05:02 / 00:47 8 lb 4.8 oz (3.765 kg) M Vag-Spont EPI  LIV     Birth Comments: none  3 Term 09/29/13 [redacted]w[redacted]d 07:23 / 00:12 7 lb 6.4 oz (3.357 kg) F Vag-Spont EPI  LIV  2 Term 03/17/12 [redacted]w[redacted]d 05:07 / 00:25 6 lb 5.4 oz (2.875 kg) M Vag-Spont EPI  LIV  1 Term 2011     Vag-Spont   LIV    Past Medical History:  Diagnosis Date   Asthma    as a child    Past Surgical History:  Procedure Laterality Date   NO PAST SURGERIES      Current Outpatient Medications on File Prior to Visit  Medication Sig Dispense Refill   azelastine (ASTELIN) 0.1 % nasal spray Place 1-2 sprays into both nostrils 2 (two) times daily as needed (runny nose). Use in each nostril as directed (Patient not taking: Reported on 10/16/2020) 30 mL 5   azelastine (OPTIVAR) 0.05 % ophthalmic solution Place 1 drop into both eyes 2 (two) times daily as needed (itchy/watery eyes). (Patient not taking: Reported on 10/16/2020) 6 mL 5    azelastine (OPTIVAR) 0.05 % ophthalmic solution Place 1 drop into both eyes 2 (two) times daily as needed. (Patient not taking: Reported on 10/16/2020) 18 mL 1   cetirizine (ZYRTEC ALLERGY) 10 MG tablet Take 1 tablet (10 mg total) by mouth 2 (two) times daily. (Patient not taking: Reported on 10/16/2020) 60 tablet 5   fluticasone (FLONASE) 50 MCG/ACT nasal spray Place 1 spray into both nostrils in the morning and at bedtime. For nasal congestion (Patient not taking: Reported on 10/16/2020) 16 g 5   medroxyPROGESTERone (DEPO-PROVERA) 150 MG/ML injection Inject 150 mg into the muscle every 3 (three) months.     montelukast (SINGULAIR) 10 MG tablet Take 1 tablet (10 mg total) by mouth at bedtime. 30 tablet 5   No current facility-administered medications on file prior to visit.    No Known Allergies  Social History:  reports that she has never smoked. She has never used smokeless tobacco. She reports that she does not drink alcohol and does not use drugs.  Family History  Problem Relation Age of Onset   Heart attack Mother    Hypertension Paternal Grandmother    Diabetes Paternal Grandmother    Allergic rhinitis Paternal Grandmother  The following portions of the patient's history were reviewed and updated as appropriate: allergies, current medications, past family history, past medical history, past social history, past surgical history and problem list.  Review of Systems Pertinent items noted in HPI and remainder of comprehensive ROS otherwise negative.  Physical Exam:  There were no vitals taken for this visit. CONSTITUTIONAL: Well-developed, well-nourished female in no acute distress.  HENT:  Normocephalic, atraumatic, External right and left ear normal.  EYES: Conjunctivae and EOM are normal. Pupils are equal, round, and reactive to light. No scleral icterus.  NECK: Normal range of motion, supple, no masses.  Normal thyroid.  SKIN: Skin is warm and dry. No rash noted. Not  diaphoretic. No erythema. No pallor. MUSCULOSKELETAL: Normal range of motion. No tenderness.  No cyanosis, clubbing, or edema. NEUROLOGIC: Alert and oriented to person, place, and time. Normal reflexes, muscle tone coordination.  PSYCHIATRIC: Normal mood and affect. Normal behavior. Normal judgment and thought content.  CARDIOVASCULAR: Normal heart rate noted, regular rhythm RESPIRATORY: Clear to auscultation bilaterally. Effort and breath sounds normal, no problems with respiration noted.  BREASTS: Symmetric in size. No masses, tenderness, skin changes, nipple drainage, or lymphadenopathy bilaterally. Performed in the presence of a chaperone. ABDOMEN: Soft, no distention noted.  No tenderness, rebound or guarding.  PELVIC: Pt declines pelvic exam. . Performed in the presence of a chaperone.  Assessment and Plan:    1. Encounter for annual routine gynecological examination - Cervical cancer screening: Discussed screening Q3 years. Reviewed importance of annual exams and limits of pap smear. Pap with HPV normal on 02/2020 - GC/CT: Discussed and recommended. Pt  declines - Gardasil:  She is unsure if she had it - Birth Control: Discussed options and their risks, benefits and common side effects; discussed VTE with estrogen containing options. Desires: Depo - Breast Health: Encouraged self breast awareness/exams. Teaching provided. - Follow-up: 12 months and prn   Routine preventative health maintenance measures emphasized. Please refer to After Visit Summary for other counseling recommendations.   Milas Hock, MD, FACOG Obstetrician & Gynecologist, [facility], [facility]

## 2021-06-23 NOTE — Therapy (Signed)
OUTPATIENT PHYSICAL THERAPY TREATMENT NOTE   Patient Name: Maria Bruce MRN: [id number] DOB:08/14/1990, 31 y.o., female Today's Date: 06/24/2021  PCP: Carmel Sacramento, MD REFERRING PROVIDER: Reymundo Poll, MD   PT End of Session - 06/24/21 1010     Visit Number 3    Number of Visits 8    Date for PT Re-Evaluation 08/05/21    Authorization Type [facility]    Authorization - Number of Visits 27    PT Start Time 1007    PT Stop Time 1045    PT Time Calculation (min) 38 min    Activity Tolerance Patient tolerated treatment well    Behavior During Therapy WFL for tasks assessed/performed              Past Medical History:  Diagnosis Date   Asthma    as a child   Past Surgical History:  Procedure Laterality Date   NO PAST SURGERIES     Patient Active Problem List   Diagnosis Date Noted   Healthcare maintenance 05/12/2021   Prediabetes 05/12/2021   Other allergic rhinitis 08/21/2020   Allergic conjunctivitis of both eyes 08/21/2020   History of asthma 08/21/2020   Chronic nasal congestion 07/05/2020   Difficulty sleeping 07/05/2020   Chronic left-sided low back pain without sciatica 07/05/2020   BMI 40.0-44.9, adult (HCC) 11/02/2019    REFERRING PROVIDER: Reymundo Poll, MD   REFERRING DIAG: Chronic left-sided low back pain without sciatica  THERAPY DIAG:  Chronic left-sided low back pain, unspecified whether sciatica present  Muscle weakness (generalized)  ONSET DATE: worsening past 3 years  PERTINENT HISTORY: None  PRECAUTIONS: None WEIGHT BEARING RESTRICTIONS: No  SUBJECTIVE: Patient reports she continues to do well. No new issues.  PAIN:  Are you having pain? No VAS scale: 0/10 (6/10 at worst) Pain location: Lower back Pain orientation: Left  PAIN TYPE: Chronic Pain description: intermittent, burning Aggravating factors: Walking, standing extended periods, lifting heavy or constantly Relieving factors: Heating pad, muscle  cream  PATIENT GOALS: Pain relief and find things to do to feel better   OBJECTIVE: (BOLDED MEASURES ASSESSED THIS VISIT) PATIENT SURVEYS:  Modified Oswestry 8/50    LUMBAR AROM   AROM AROM  06/10/2021  06/17/2021  Flexion WFL   Extension WFL   Right lateral flexion WFL   Left lateral flexion WFL   Right rotation WFL   Left rotation WFL - mild left sided discomfort WFL - patient reports no discomfort   LE MMT:                        Core strength grossly 3/5 MMT   MMT Right 06/10/2021 Left 06/10/2021  Hip flexion 4+ 4+  Hip extension 4 4-  Hip abduction 4 4  Knee flexion 5 5  Knee extension 5 5   (Blank rows = not tested)   FUNCTIONAL TESTS:  Lifting: patient able to perform proper hip hinge technique and maintain lumbopelvic control with consistent cueing for technique     TODAY'S TREATMENT: 06/24/2021: Recumbent bike L2 x 3 min while taking subjective Piriformis stretch x 20 sec each Supine lumbar rotation stretch x 3 with 5 sec hold Bridge x 10 with 3 sec hold Marching bridge x 5 90-90 abdominal isometrics 2 x 6 with 3 sec hold - lifting/lowering 1 leg at a time while maintaining lumbopelvic control Sidelying hip abduction 2 x 10 each Squat to chair tap x 10, holding 10# at chest x  10 Deadlift with 25# on 6" box 2 x 10   06/17/2021: Recumbent bike L2 x 5 min while taking subjective Supine lumbar rotation stretch x 3 with 5 sec hold alternating SLR with focus on core activation x 10 each Bridge 2 x 10 with 3 sec hold 90-90 abdominal isometrics 2 x 6 with 3 sec hold - lifting/lowering 1 leg at a time while maintaining lumbopelvic control Side clamshell with green x 15 each Sit to stand holding 10# at chest 2 x 10 - focus on maintaining core activation and hip hinge technique Pallof press with green 2 x 5 with 3 sec hold each Springboard pulldown with yellow springs 2 x 10 Sidelying hip abduction 2 x 10 each  06/10/2021: SLR with focus on core activation x 10  each Bridge 10 x 3 sec hold Side clamshell with green x 15 each     PATIENT EDUCATION:  Education details: HEP Person educated: Patient Education method: Programmer, multimedia, Demonstration, Actor cues, Verbal cues, and Handouts Education comprehension: verbalized understanding, returned demonstration, verbal cues required, tactile cues required, and needs further education   HOME EXERCISE PROGRAM: Access Code: [email or web address]     ASSESSMENT: CLINICAL IMPRESSION: Patient tolerated therapy well with no adverse effects. Therapy focused on continued progress of core/hip strengthening and lumbopelvic control. Initiated lifting mechanics this visit with good tolerance. Patient did require consistent cueing for proper technique and maintaining lumbopelvic control to avoid excessive lumbar motion. Updated HEP to further progress strengthening. Patient would benefit from continued skilled PT to progress her strength and control in order to reduce pain and maximize her functional ability.    GOALS: Goals reviewed with patient? Yes   SHORT TERM GOALS:   STG Name Target Date Goal status  1 Patient will be I with initial HEP in order to progress with therapy. Baseline: provided at eval 07/08/2021 INITIAL  2 Patient will report pain no more than 6/10 with all activity to reduce functional limitations. Baseline: pain up to 8/10 07/08/2021 INITIAL  3 Patient will demonstrate proper lifting mechanics to improve lumbopelvic control and reduce back pain Baseline: patient exhibits deviations with lifting mechanics 07/08/2021 INITIAL    LONG TERM GOALS:    LTG Name Target Date Goal status  1 Patient will be I with final HEP to maintain progress from PT. Baseline: provided at eval 08/05/2021 INITIAL  2 Patient will report improved functional ability with </= 3/50 modified oswestry Baseline: 08/05/2021 INITIAL  3 Patient will demonstrate core and hip strength >/= 4/5 MMT in order to reduce pain with lifting, standing  and walking extended periods Baseline: 08/05/2021 INITIAL  4 Patient will report no more than 3/10 with all activity and standing tasks to reduce functional limitations Baseline: up to 8/10 pain 08/05/2021 INITIAL      PLAN: PT FREQUENCY: 1x/week   PT DURATION: 8 weeks   PLANNED INTERVENTIONS: Therapeutic exercises, Therapeutic activity, Neuro Muscular re-education, Balance training, Gait training, Patient/Family education, Joint mobilization, Aquatic Therapy, Dry Needling, Electrical stimulation, Spinal mobilization, Cryotherapy, Moist heat, and Manual therapy   PLAN FOR NEXT SESSION: Review HEP and progress PRN, manual/dry needling for left lumbar paraspinals, focus on core strengthening and progress lifting mechanics    Rosana Hoes, PT, DPT, LAT, ATC 06/24/21  10:48 AM Phone: [phone] Fax: [phone]

## 2021-06-23 NOTE — Addendum Note (Signed)
Addended by: Henrietta Dine on: 06/23/2021 03:40 PM   Modules accepted: Orders

## 2021-06-24 ENCOUNTER — Other Ambulatory Visit: Payer: Self-pay

## 2021-06-24 ENCOUNTER — Encounter: Payer: Self-pay | Admitting: Physical Therapy

## 2021-06-24 ENCOUNTER — Ambulatory Visit: Payer: Medicaid Other | Admitting: Physical Therapy

## 2021-06-24 DIAGNOSIS — M6281 Muscle weakness (generalized): Secondary | ICD-10-CM

## 2021-06-24 DIAGNOSIS — M545 Low back pain, unspecified: Secondary | ICD-10-CM | POA: Diagnosis not present

## 2021-06-24 DIAGNOSIS — G8929 Other chronic pain: Secondary | ICD-10-CM

## 2021-06-24 NOTE — Patient Instructions (Signed)
Access Code: [email or web address] URL: [email or web address] Date: 06/24/2021 Prepared by: Rosana Hoes  Exercises Supine Lower Trunk Rotation - 1 x daily - 5 x weekly - 5 reps - 10 secnds hold Supine 90/90 Abdominal Bracing - 1 x daily - 5 x weekly - 3 sets - 5 reps - 5 seconds hold Marching Bridge - 1 x daily - 5 x weekly - 3 sets - 5 reps Sidelying Hip Abduction - 1 x daily - 5 x weekly - 2 sets - 10 reps Squat with Chair Touch - 1 x daily - 5 x weekly - 3 sets - 10 reps

## 2021-06-30 DIAGNOSIS — H66001 Acute suppurative otitis media without spontaneous rupture of ear drum, right ear: Secondary | ICD-10-CM | POA: Diagnosis not present

## 2021-06-30 NOTE — Therapy (Signed)
OUTPATIENT PHYSICAL THERAPY TREATMENT NOTE   Patient Name: Maria DandyKimberly Rosa Maldonado MRN: [id number] DOB:08/31/1990, 31 y.o., female Today's Date: 07/01/2021  PCP: Carmel SacramentoPatel, Monik, MD REFERRING PROVIDER: Carmel SacramentoPatel, Monik, MD   PT End of Session - 07/01/21 1016     Visit Number 4    Number of Visits 8    Date for PT Re-Evaluation 08/05/21    Authorization Type [employer]    Authorization - Number of Visits 27    PT Start Time 1008    PT Stop Time 1046    PT Time Calculation (min) 38 min    Activity Tolerance Patient tolerated treatment well    Behavior During Therapy WFL for tasks assessed/performed               Past Medical History:  Diagnosis Date   Asthma    as a child   Past Surgical History:  Procedure Laterality Date   NO PAST SURGERIES     Patient Active Problem List   Diagnosis Date Noted   Healthcare maintenance 05/12/2021   Prediabetes 05/12/2021   Other allergic rhinitis 08/21/2020   Allergic conjunctivitis of both eyes 08/21/2020   History of asthma 08/21/2020   Chronic nasal congestion 07/05/2020   Difficulty sleeping 07/05/2020   Chronic left-sided low back pain without sciatica 07/05/2020   BMI 40.0-44.9, adult (HCC) 11/02/2019    REFERRING PROVIDER: Reymundo PollGuilloud, Carolyn, MD   REFERRING DIAG: Chronic left-sided low back pain without sciatica  THERAPY DIAG:  Chronic left-sided low back pain, unspecified whether sciatica present  Muscle weakness (generalized)  ONSET DATE: worsening past 3 years  PERTINENT HISTORY: None  PRECAUTIONS: None WEIGHT BEARING RESTRICTIONS: No  SUBJECTIVE: Patient reports she feels the exercises are helping, she is doing the same amount of activity but with less pain.  PAIN:  Are you having pain? No VAS scale: 0/10 (6/10 at worst) Pain location: Lower back Pain orientation: Left  PAIN TYPE: Chronic Pain description: intermittent, burning Aggravating factors: Walking, standing extended periods, lifting heavy or  constantly Relieving factors: Heating pad, muscle cream  PATIENT GOALS: Pain relief and find things to do to feel better   OBJECTIVE: (BOLDED MEASURES ASSESSED THIS VISIT) PATIENT SURVEYS:  Modified Oswestry 8/50    LUMBAR AROM   AROM AROM  06/10/2021  06/17/2021  Flexion WFL   Extension WFL   Right lateral flexion WFL   Left lateral flexion WFL   Right rotation WFL   Left rotation WFL - mild left sided discomfort WFL - patient reports no discomfort   LE MMT:                        Core strength grossly 4-/5 MMT   MMT Right 06/10/2021 Left 06/10/2021 Right / Left 07/01/2021  Hip flexion 4+ 4+   Hip extension 4 4- 4 / 4  Hip abduction 4 4 4  / 4  Knee flexion 5 5   Knee extension 5 5    (Blank rows = not tested)   FUNCTIONAL TESTS:  Lifting: patient able to perform proper hip hinge technique and maintain lumbopelvic control with consistent cueing for technique - 06/24/2021     TODAY'S TREATMENT: 07/01/2021: Recumbent bike L2 x 5 min while taking subjective LTR in figure-4 position 3 x 10 seconds Piriformis stretch x 30 seconds each Marching bridge with feet on BOSO 3 x 5 90-90 alternating heel tap 3 x 10 - towel roll placed under lumbar for cue Modified side  plank 5 x 10 sec hold each Deadlift 25# 3 x 10 Pull down and abdominal activation with black band 2 x 10   06/24/2021: Recumbent bike L2 x 3 min while taking subjective Piriformis stretch x 20 sec each Supine lumbar rotation stretch x 3 with 5 sec hold Bridge x 10 with 3 sec hold Marching bridge x 5 90-90 abdominal isometrics 2 x 6 with 3 sec hold - lifting/lowering 1 leg at a time while maintaining lumbopelvic control Sidelying hip abduction 2 x 10 each Squat to chair tap x 10, holding 10# at chest x 10 Deadlift with 25# on 6" box 2 x 10  06/17/2021: Recumbent bike L2 x 5 min while taking subjective Supine lumbar rotation stretch x 3 with 5 sec hold alternating SLR with focus on core activation x 10  each Bridge 2 x 10 with 3 sec hold 90-90 abdominal isometrics 2 x 6 with 3 sec hold - lifting/lowering 1 leg at a time while maintaining lumbopelvic control Side clamshell with green x 15 each Sit to stand holding 10# at chest 2 x 10 - focus on maintaining core activation and hip hinge technique Pallof press with green 2 x 5 with 3 sec hold each Springboard pulldown with yellow springs 2 x 10 Sidelying hip abduction 2 x 10 each     PATIENT EDUCATION:  Education details: HEP Person educated: Patient Education method: Programmer, multimedia, Facilities manager, Verbal cues Education comprehension: verbalized understanding, returned demonstration, verbal cues required, and needs further education   HOME EXERCISE PROGRAM: Access Code: [email or web address]     ASSESSMENT: CLINICAL IMPRESSION: Patient tolerated therapy well with no adverse effects. Therapy continues to focus on progressing core and hip strengthening in order to reduce pain with activity. Patient is progressing well and demonstrates an improvement in her strength this visit. She reports overall improvement in pain but does continue to demonstrate difficulty with lumbopelvic control with more challenging core exercises and states she can feel it in her low back when she goes in lumbar lordosis due to decreased abdominal control. No changes made to HEP this visit. Patient would benefit from continued skilled PT to progress her strength and control in order to reduce pain and maximize her functional ability.    GOALS: Goals reviewed with patient? Yes   SHORT TERM GOALS:   STG Name Target Date Goal status  1 Patient will be I with initial HEP in order to progress with therapy. Baseline: provided at eval 07/08/2021 INITIAL  2 Patient will report pain no more than 6/10 with all activity to reduce functional limitations. Baseline: pain up to 8/10 07/08/2021 INITIAL  3 Patient will demonstrate proper lifting mechanics to improve lumbopelvic control and reduce  back pain Baseline: patient exhibits deviations with lifting mechanics 07/08/2021 INITIAL    LONG TERM GOALS:    LTG Name Target Date Goal status  1 Patient will be I with final HEP to maintain progress from PT. Baseline: provided at eval 08/05/2021 INITIAL  2 Patient will report improved functional ability with </= 3/50 modified oswestry Baseline: 08/05/2021 INITIAL  3 Patient will demonstrate core and hip strength >/= 4/5 MMT in order to reduce pain with lifting, standing and walking extended periods Baseline: 08/05/2021 INITIAL  4 Patient will report no more than 3/10 with all activity and standing tasks to reduce functional limitations Baseline: up to 8/10 pain 08/05/2021 INITIAL      PLAN: PT FREQUENCY: 1x/week   PT DURATION: 8 weeks   PLANNED INTERVENTIONS: Therapeutic exercises,  Therapeutic activity, Neuro Muscular re-education, Balance training, Gait training, Patient/Family education, Joint mobilization, Aquatic Therapy, Dry Needling, Electrical stimulation, Spinal mobilization, Cryotherapy, Moist heat, and Manual therapy   PLAN FOR NEXT SESSION: Review HEP and progress PRN, manual/dry needling for left lumbar paraspinals, focus on core strengthening and progress lifting mechanics    Rosana Hoes, PT, DPT, LAT, ATC 07/01/21  10:54 AM Phone: [phone] Fax: [phone]

## 2021-07-01 ENCOUNTER — Ambulatory Visit: Payer: Medicaid Other | Admitting: Physical Therapy

## 2021-07-01 ENCOUNTER — Encounter: Payer: Self-pay | Admitting: Physical Therapy

## 2021-07-01 ENCOUNTER — Other Ambulatory Visit: Payer: Self-pay

## 2021-07-01 DIAGNOSIS — M545 Low back pain, unspecified: Secondary | ICD-10-CM

## 2021-07-01 DIAGNOSIS — M6281 Muscle weakness (generalized): Secondary | ICD-10-CM

## 2021-07-07 ENCOUNTER — Telehealth: Payer: Self-pay

## 2021-07-07 NOTE — Telephone Encounter (Signed)
Called pt - stated this is the first time going to the dentist in about 3 yrs and she's very anxious and nervous. She's having surgery tomorrow to remove 2 wisdom teeth and a molar. Requesting something for anxiety, to help her relax.  Thanks

## 2021-07-07 NOTE — Telephone Encounter (Signed)
Pt states she is having dental surgery tomorrow, requesting medication to help her calm down. Please call pt back.

## 2021-07-08 ENCOUNTER — Ambulatory Visit: Payer: Medicaid Other | Admitting: Physical Therapy

## 2021-07-08 NOTE — Telephone Encounter (Signed)
Called pt - no answer; left message to call the office if needed. 

## 2021-07-15 ENCOUNTER — Ambulatory Visit: Payer: Medicaid Other | Attending: Internal Medicine | Admitting: Physical Therapy

## 2021-07-15 ENCOUNTER — Encounter: Payer: Self-pay | Admitting: Physical Therapy

## 2021-07-15 ENCOUNTER — Other Ambulatory Visit: Payer: Self-pay

## 2021-07-15 DIAGNOSIS — M6281 Muscle weakness (generalized): Secondary | ICD-10-CM | POA: Insufficient documentation

## 2021-07-15 DIAGNOSIS — G8929 Other chronic pain: Secondary | ICD-10-CM | POA: Diagnosis present

## 2021-07-15 DIAGNOSIS — M545 Low back pain, unspecified: Secondary | ICD-10-CM | POA: Insufficient documentation

## 2021-07-15 NOTE — Therapy (Signed)
OUTPATIENT PHYSICAL THERAPY TREATMENT NOTE   Patient Name: Maria Bruce MRN: [id number] DOB:[birth year], 31 y.o., female Today's Date: 07/15/2021  PCP: Carmel Sacramento, MD REFERRING PROVIDER: Carmel Sacramento, MD   PT End of Session - 07/15/21 1049     Visit Number 5    Number of Visits 8    Date for PT Re-Evaluation 08/05/21    Authorization Type [facility]    Authorization - Number of Visits 27    PT Start Time 1050    PT Stop Time 1128    PT Time Calculation (min) 38 min    Activity Tolerance Patient tolerated treatment well    Behavior During Therapy WFL for tasks assessed/performed                Past Medical History:  Diagnosis Date   Asthma    as a child   Past Surgical History:  Procedure Laterality Date   NO PAST SURGERIES     Patient Active Problem List   Diagnosis Date Noted   Healthcare maintenance 05/12/2021   Prediabetes 05/12/2021   Other allergic rhinitis 08/21/2020   Allergic conjunctivitis of both eyes 08/21/2020   History of asthma 08/21/2020   Chronic nasal congestion 07/05/2020   Difficulty sleeping 07/05/2020   Chronic left-sided low back pain without sciatica 07/05/2020   BMI 40.0-44.9, adult (HCC) 11/02/2019    REFERRING PROVIDER: Reymundo Poll, MD   REFERRING DIAG: Chronic left-sided low back pain without sciatica  THERAPY DIAG:  Chronic left-sided low back pain, unspecified whether sciatica present  Muscle weakness (generalized)  ONSET DATE: worsening past 3 years  PERTINENT HISTORY: None  PRECAUTIONS: None WEIGHT BEARING RESTRICTIONS: No  SUBJECTIVE: Patient reports she had her wisdom teeth out so had to miss the last appointment. Overall she is doing well with no new issues.  PAIN:  Are you having pain? No VAS scale: 0/10 (4/10 at worst) Pain location: Lower back Pain orientation: Left  PAIN TYPE: Chronic Pain description: intermittent, burning Aggravating factors: Walking, standing extended periods, lifting  heavy or constantly Relieving factors: Heating pad, muscle cream  PATIENT GOALS: Pain relief and find things to do to feel better   OBJECTIVE: (BOLDED MEASURES ASSESSED THIS VISIT) PATIENT SURVEYS:  Modified Oswestry 8/50    LUMBAR AROM   AROM AROM  06/10/2021  06/17/2021  07/15/2021  Flexion WFL  WFL  Extension [facility]  WFL  Right lateral flexion [facility]  WFL  Left lateral flexion [facility]  WFL  Right rotation [facility]  WFL  Left rotation WFL - mild left sided discomfort WFL - patient reports no discomfort WFL   LE MMT:                        Core strength grossly 4-/5 MMT   MMT Right 06/10/2021 Left 06/10/2021 Right / Left 07/01/2021 Right / Left 07/15/2021  Hip flexion 4+ 4+    Hip extension 4 4- 4 / 4 4 / 4  Hip abduction 4 4 4  / 4 4 / 4  Knee flexion 5 5    Knee extension 5 5     FUNCTIONAL TESTS:  Lifting: patient able to perform proper hip hinge technique and maintain lumbopelvic control with consistent cueing for technique - 06/24/2021     TODAY'S TREATMENT: 07/15/2021: Recumbent bike L2 x 5 min while taking subjective LTR in figure-4 position 3 x 5 sec Piriformis stretch x 20 sec each Figure-4 bridge 3 x 6 90-90 alternating  heel tap with physioball 3 x 5 Modified side plank 3 x 20 sec hold each Modified front plank 3 x 20 sec hold Deadlift 30# 3 x 10 Pallof press with red power band 2 x 10 each   07/01/2021: Recumbent bike L2 x 5 min while taking subjective LTR in figure-4 position 3 x 10 seconds Piriformis stretch x 30 seconds each Marching bridge with feet on BOSO 3 x 5 90-90 alternating heel tap 3 x 10 - towel roll placed under lumbar for cue Modified side plank 5 x 10 sec hold each Deadlift 25# 3 x 10 Pull down and abdominal activation with black band 2 x 10  06/24/2021: Recumbent bike L2 x 3 min while taking subjective Piriformis stretch x 20 sec each Supine lumbar rotation stretch x 3 with 5 sec hold Bridge x 10 with 3 sec hold Marching bridge x 5 90-90 abdominal  isometrics 2 x 6 with 3 sec hold - lifting/lowering 1 leg at a time while maintaining lumbopelvic control Sidelying hip abduction 2 x 10 each Squat to chair tap x 10, holding 10# at chest x 10 Deadlift with 25# on 6" box 2 x 10  06/17/2021: Recumbent bike L2 x 5 min while taking subjective Supine lumbar rotation stretch x 3 with 5 sec hold alternating SLR with focus on core activation x 10 each Bridge 2 x 10 with 3 sec hold 90-90 abdominal isometrics 2 x 6 with 3 sec hold - lifting/lowering 1 leg at a time while maintaining lumbopelvic control Side clamshell with green x 15 each Sit to stand holding 10# at chest 2 x 10 - focus on maintaining core activation and hip hinge technique Pallof press with green 2 x 5 with 3 sec hold each Springboard pulldown with yellow springs 2 x 10 Sidelying hip abduction 2 x 10 each   PATIENT EDUCATION:  Education details: HEP Person educated: Patient Education method: Programmer, multimedia, Facilities manager, Verbal cues Education comprehension: verbalized understanding, returned demonstration, verbal cues required, and needs further education   HOME EXERCISE PROGRAM: Access Code: [email or web address]     ASSESSMENT: CLINICAL IMPRESSION: Patient tolerated therapy well with no adverse effects. Therapy focused on continued strengthening for core and proximal hip musculature. She seems to be progressing well with her exercises but does continue to demonstrate difficulty with abdominal control to avoid lumbar lordosis with exercise. She did report slight increase in low back discomfort when she went in to excessive lumbar extension but this resolved when cued for proper abdominal control. Updated HEP this visit for core strength progression. Patient would benefit from continued skilled PT to progress her strength and control in order to reduce pain and maximize her functional ability.    GOALS: Goals reviewed with patient? Yes   SHORT TERM GOALS:   STG Name Target Date Goal  status  1 Patient will be I with initial HEP in order to progress with therapy. Baseline: independent with initial HEP 07/08/2021 ACHIEVED  2 Patient will report pain no more than 6/10 with all activity to reduce functional limitations. Baseline: pain up to 4/10 07/08/2021 ACHIEVED  3 Patient will demonstrate proper lifting mechanics to improve lumbopelvic control and reduce back pain Baseline: patient exhibits proper lifting mechanics 07/08/2021 ACHIEVED    LONG TERM GOALS:    LTG Name Target Date Goal status  1 Patient will be I with final HEP to maintain progress from PT. Baseline: progressing HEP 08/05/2021 ONGOING  2 Patient will report improved functional ability with </= 3/50 modified  oswestry Baseline: 08/05/2021 INITIAL  3 Patient will demonstrate core and hip strength >/= 4/5 MMT in order to reduce pain with lifting, standing and walking extended periods Baseline: continues to demonstrate core and hip weakness 08/05/2021 ONGOING  4 Patient will report no more than 3/10 with all activity and standing tasks to reduce functional limitations Baseline: up to 4/10 pain 08/05/2021 ONGOING      PLAN: PT FREQUENCY: 1x/week   PT DURATION: 8 weeks   PLANNED INTERVENTIONS: Therapeutic exercises, Therapeutic activity, Neuro Muscular re-education, Balance training, Gait training, Patient/Family education, Joint mobilization, Aquatic Therapy, Dry Needling, Electrical stimulation, Spinal mobilization, Cryotherapy, Moist heat, and Manual therapy   PLAN FOR NEXT SESSION: Review HEP and progress PRN, manual/dry needling for left lumbar paraspinals, focus on core strengthening and progress lifting mechanics    Rosana Hoes, PT, DPT, LAT, ATC 07/15/21  11:28 AM Phone: [phone] Fax: [phone]

## 2021-07-15 NOTE — Patient Instructions (Signed)
Access Code: [email or web address] URL: [email or web address] Date: 07/15/2021 Prepared by: Rosana Hoes  Exercises Supine Lower Trunk Rotation - 1 x daily - 5 x weekly - 5 reps - 10 secnds hold Supine 90/90 Abdominal Bracing - 1 x daily - 5 x weekly - 3 sets - 5 reps - 5 seconds hold Marching Bridge - 1 x daily - 5 x weekly - 3 sets - 5 reps Sidelying Hip Abduction - 1 x daily - 5 x weekly - 2 sets - 10 reps Squat with Chair Touch - 1 x daily - 5 x weekly - 3 sets - 10 reps Side Plank on Knees - 1 x daily - 7 x weekly - 3 reps - 30 seconds hold Plank on Knees - 1 x daily - 7 x weekly - 3 reps - 30 seconds hold

## 2021-07-22 ENCOUNTER — Encounter: Payer: Medicaid Other | Admitting: Physical Therapy

## 2021-07-23 ENCOUNTER — Encounter: Payer: Medicaid Other | Admitting: Physical Therapy

## 2021-07-24 ENCOUNTER — Ambulatory Visit: Payer: Medicaid Other

## 2021-07-24 ENCOUNTER — Other Ambulatory Visit: Payer: Self-pay

## 2021-07-24 DIAGNOSIS — M545 Low back pain, unspecified: Secondary | ICD-10-CM

## 2021-07-24 DIAGNOSIS — M6281 Muscle weakness (generalized): Secondary | ICD-10-CM

## 2021-07-24 DIAGNOSIS — G8929 Other chronic pain: Secondary | ICD-10-CM

## 2021-07-24 NOTE — Therapy (Signed)
OUTPATIENT PHYSICAL THERAPY TREATMENT NOTE   Patient Name: Jaonna Sudar MRN: [id number] DOB:02-09-1991, 31 y.o., female Today's Date: 07/24/2021  PCP: Carmel Sacramento, MD REFERRING PROVIDER: Reymundo Poll, MD   PT End of Session - 07/24/21 1219     Visit Number 6    Number of Visits 8    Date for PT Re-Evaluation 08/05/21    Authorization Type [facility]    Authorization - Number of Visits 27    PT Start Time 1227    PT Stop Time 1300    PT Time Calculation (min) 33 min    Activity Tolerance Patient tolerated treatment well    Behavior During Therapy WFL for tasks assessed/performed                 Past Medical History:  Diagnosis Date   Asthma    as a child   Past Surgical History:  Procedure Laterality Date   NO PAST SURGERIES     Patient Active Problem List   Diagnosis Date Noted   Healthcare maintenance 05/12/2021   Prediabetes 05/12/2021   Other allergic rhinitis 08/21/2020   Allergic conjunctivitis of both eyes 08/21/2020   History of asthma 08/21/2020   Chronic nasal congestion 07/05/2020   Difficulty sleeping 07/05/2020   Chronic left-sided low back pain without sciatica 07/05/2020   BMI 40.0-44.9, adult (HCC) 11/02/2019    REFERRING PROVIDER: Reymundo Poll, MD   REFERRING DIAG: Chronic left-sided low back pain without sciatica  THERAPY DIAG:  Chronic left-sided low back pain, unspecified whether sciatica present  Muscle weakness (generalized)  ONSET DATE: worsening past 3 years  PERTINENT HISTORY: None  PRECAUTIONS: None WEIGHT BEARING RESTRICTIONS: No  SUBJECTIVE: I'm not having much pain today, maybe a 3.  PAIN:  Are you having pain? No VAS scale: 3/10 (4/10 at worst) Pain location: Lower back Pain orientation: Left  PAIN TYPE: Chronic Pain description: intermittent, burning Aggravating factors: Walking, standing extended periods, lifting heavy or constantly Relieving factors: Heating pad, muscle cream  PATIENT  GOALS: Pain relief and find things to do to feel better   OBJECTIVE: (BOLDED MEASURES ASSESSED THIS VISIT) PATIENT SURVEYS:  Modified Oswestry 8/50    LUMBAR AROM   AROM AROM  06/10/2021  06/17/2021  07/15/2021  Flexion WFL  WFL  Extension [facility]  WFL  Right lateral flexion [facility]  WFL  Left lateral flexion [facility]  WFL  Right rotation [facility]  WFL  Left rotation WFL - mild left sided discomfort WFL - patient reports no discomfort WFL   LE MMT:                        Core strength grossly 4-/5 MMT   MMT Right 06/10/2021 Left 06/10/2021 Right / Left 07/01/2021 Right / Left 07/15/2021 Right / Left 07/24/2021  Hip flexion 4+ 4+     Hip extension 4 4- 4 / 4 4 / 4   Hip abduction 4 4 4  / 4 4 / 4 4+/4+  Knee flexion 5 5     Knee extension 5 5      FUNCTIONAL TESTS:  Lifting: patient able to perform proper hip hinge technique and maintain lumbopelvic control with consistent cueing for technique - 06/24/2021     TODAY'S TREATMENT: 07/24/2021: LTR in figure-4 position 3 x 10 sec Piriformis stretch 2 x 30 sec each Figure-4 bridge 2 x 10 90-90 alternating heel tap 2 x 5 Dead bugs 2 x 10 Modified side plank  3 x 20 sec hold each Modified front plank 3 x 20 sec hold Bird dogs with dowel on lumbar for pelvic control Childs pose x 20 sec Hip abduction 12.5# 2 x 10 BIL Squat to mat table 2 x 10  07/15/2021: Recumbent bike L2 x 5 min while taking subjective LTR in figure-4 position 3 x 5 sec Piriformis stretch x 20 sec each Figure-4 bridge 3 x 6 90-90 alternating heel tap with physioball 3 x 5 Modified side plank 3 x 20 sec hold each Modified front plank 3 x 20 sec hold Deadlift 30# 3 x 10 Pallof press with red power band 2 x 10 each   07/01/2021: Recumbent bike L2 x 5 min while taking subjective LTR in figure-4 position 3 x 10 seconds Piriformis stretch x 30 seconds each Marching bridge with feet on BOSO 3 x 5 90-90 alternating heel tap 3 x 10 - towel roll placed under lumbar for cue Modified  side plank 5 x 10 sec hold each Deadlift 25# 3 x 10 Pull down and abdominal activation with black band 2 x 10  06/24/2021: Recumbent bike L2 x 3 min while taking subjective Piriformis stretch x 20 sec each Supine lumbar rotation stretch x 3 with 5 sec hold Bridge x 10 with 3 sec hold Marching bridge x 5 90-90 abdominal isometrics 2 x 6 with 3 sec hold - lifting/lowering 1 leg at a time while maintaining lumbopelvic control Sidelying hip abduction 2 x 10 each Squat to chair tap x 10, holding 10# at chest x 10 Deadlift with 25# on 6" box 2 x 10  06/17/2021: Recumbent bike L2 x 5 min while taking subjective Supine lumbar rotation stretch x 3 with 5 sec hold alternating SLR with focus on core activation x 10 each Bridge 2 x 10 with 3 sec hold 90-90 abdominal isometrics 2 x 6 with 3 sec hold - lifting/lowering 1 leg at a time while maintaining lumbopelvic control Side clamshell with green x 15 each Sit to stand holding 10# at chest 2 x 10 - focus on maintaining core activation and hip hinge technique Pallof press with green 2 x 5 with 3 sec hold each Springboard pulldown with yellow springs 2 x 10 Sidelying hip abduction 2 x 10 each   PATIENT EDUCATION:  Education details: HEP Person educated: Patient Education method: Programmer, multimedia, Facilities manager, Verbal cues Education comprehension: verbalized understanding, returned demonstration, verbal cues required, and needs further education   HOME EXERCISE PROGRAM: Access Code: [email or web address]     ASSESSMENT: CLINICAL IMPRESSION: Patient arrived 10 minutes late to appointment, delaying treatment time. She was able to complete all prescribed exercises without any adverse effects or increase in pain. Focus of session on strengthening proximal hip and core. She reports HEP compliance. Patient continues to benefit from skilled PT services and should be progressed as able to improve functional independence.     GOALS: Goals reviewed with patient?  Yes   SHORT TERM GOALS:   STG Name Target Date Goal status  1 Patient will be I with initial HEP in order to progress with therapy. Baseline: independent with initial HEP 07/08/2021 ACHIEVED  2 Patient will report pain no more than 6/10 with all activity to reduce functional limitations. Baseline: pain up to 4/10 07/08/2021 ACHIEVED  3 Patient will demonstrate proper lifting mechanics to improve lumbopelvic control and reduce back pain Baseline: patient exhibits proper lifting mechanics 07/08/2021 ACHIEVED    LONG TERM GOALS:    LTG Name Target Date Goal  status  1 Patient will be I with final HEP to maintain progress from PT. Baseline: progressing HEP 08/05/2021 ONGOING  2 Patient will report improved functional ability with </= 3/50 modified oswestry Baseline: 08/05/2021 INITIAL  3 Patient will demonstrate core and hip strength >/= 4/5 MMT in order to reduce pain with lifting, standing and walking extended periods Baseline: continues to demonstrate core and hip weakness 08/05/2021 ONGOING  4 Patient will report no more than 3/10 with all activity and standing tasks to reduce functional limitations Baseline: up to 4/10 pain 08/05/2021 ONGOING      PLAN: PT FREQUENCY: 1x/week   PT DURATION: 8 weeks   PLANNED INTERVENTIONS: Therapeutic exercises, Therapeutic activity, Neuro Muscular re-education, Balance training, Gait training, Patient/Family education, Joint mobilization, Aquatic Therapy, Dry Needling, Electrical stimulation, Spinal mobilization, Cryotherapy, Moist heat, and Manual therapy   PLAN FOR NEXT SESSION: Review HEP and progress PRN, manual/dry needling for left lumbar paraspinals, focus on core strengthening and progress lifting mechanics   Harland German, PTA 07/24/21 1:49 PM

## 2021-07-28 NOTE — Therapy (Signed)
OUTPATIENT PHYSICAL THERAPY TREATMENT NOTE   Patient Name: Maria Bruce MRN: [id number] DOB:1991-06-06, 31 y.o., female Today's Date: 07/29/2021  PCP: Carmel Sacramento, MD REFERRING PROVIDER: Carmel Sacramento, MD   PT End of Session - 07/29/21 1052     Visit Number 7    Number of Visits 8    Date for PT Re-Evaluation 08/05/21    Authorization Type [employer]    Authorization - Number of Visits 27    PT Start Time 1049    PT Stop Time 1130    PT Time Calculation (min) 41 min    Activity Tolerance Patient tolerated treatment well    Behavior During Therapy WFL for tasks assessed/performed                  Past Medical History:  Diagnosis Date   Asthma    as a child   Past Surgical History:  Procedure Laterality Date   NO PAST SURGERIES     Patient Active Problem List   Diagnosis Date Noted   Healthcare maintenance 05/12/2021   Prediabetes 05/12/2021   Other allergic rhinitis 08/21/2020   Allergic conjunctivitis of both eyes 08/21/2020   History of asthma 08/21/2020   Chronic nasal congestion 07/05/2020   Difficulty sleeping 07/05/2020   Chronic left-sided low back pain without sciatica 07/05/2020   BMI 40.0-44.9, adult (HCC) 11/02/2019    REFERRING PROVIDER: Reymundo Poll, MD   REFERRING DIAG: Chronic left-sided low back pain without sciatica  THERAPY DIAG:  Chronic left-sided low back pain, unspecified whether sciatica present  Muscle weakness (generalized)  ONSET DATE: worsening past 3 years  PERTINENT HISTORY: None  PRECAUTIONS: None WEIGHT BEARING RESTRICTIONS: No  SUBJECTIVE: Patient reports she continued to do well, no new issues reported since last visit.  PAIN:  Are you having pain? No VAS scale: 3/10 (4/10 at worst) Pain location: Lower back Pain orientation: Left  PAIN TYPE: Chronic Pain description: intermittent, burning Aggravating factors: Walking, standing extended periods, lifting heavy or constantly Relieving factors:  Heating pad, muscle cream  PATIENT GOALS: Pain relief and find things to do to feel better   OBJECTIVE: (BOLDED MEASURES ASSESSED THIS VISIT) PATIENT SURVEYS:  Modified Oswestry 8/50    LUMBAR AROM   AROM AROM  06/10/2021  06/17/2021  07/15/2021  Flexion WFL  WFL  Extension [facility]  WFL  Right lateral flexion [facility]  WFL  Left lateral flexion [facility]  WFL  Right rotation [facility]  WFL  Left rotation WFL - mild left sided discomfort WFL - patient reports no discomfort WFL   LE MMT:                        Core strength grossly 4-/5 MMT   MMT Right 06/10/2021 Left 06/10/2021 Right / Left 07/01/2021 Right / Left 07/15/2021 Right / Left 07/24/2021  Hip flexion 4+ 4+     Hip extension 4 4- 4 / 4 4 / 4   Hip abduction 4 4 4  / 4 4 / 4 4+/4+  Knee flexion 5 5     Knee extension 5 5      FUNCTIONAL TESTS:  Lifting: patient able to perform proper hip hinge technique and maintain lumbopelvic control with consistent cueing for technique - 06/24/2021     TODAY'S TREATMENT: 07/29/2021: Recumbent bike L2 x 5 min while taking subjective Figure-4 bridge x 10 each Modified side plank hold with clamshell 2 x 10 each Modified front plank 3 x  30 sec Deadlift 45# 3 x 10 Pallof press with green tubing 2 x 10 each Farmer's carry 25# with green power band 2 x 20 steps each Springboard bar pull down with yellow springs 2 x 10   07/24/2021: LTR in figure-4 position 3 x 10 sec Piriformis stretch 2 x 30 sec each Figure-4 bridge 2 x 10 90-90 alternating heel tap 2 x 5 Dead bugs 2 x 10 Modified side plank 3 x 20 sec hold each Modified front plank 3 x 20 sec hold Bird dogs with dowel on lumbar for pelvic control Childs pose x 20 sec Hip abduction 12.5# 2 x 10 BIL Squat to mat table 2 x 10  07/15/2021: Recumbent bike L2 x 5 min while taking subjective LTR in figure-4 position 3 x 5 sec Piriformis stretch x 20 sec each Figure-4 bridge 3 x 6 90-90 alternating heel tap with physioball 3 x 5 Modified side plank 3 x  20 sec hold each Modified front plank 3 x 20 sec hold Deadlift 30# 3 x 10 Pallof press with red power band 2 x 10 each   PATIENT EDUCATION:  Education details: HEP Person educated: Patient Education method: Consulting civil engineer, Media planner, Verbal cues Education comprehension: verbalized understanding, returned demonstration, verbal cues required, and needs further education   HOME EXERCISE PROGRAM: Access Code: [email or web address]     ASSESSMENT: CLINICAL IMPRESSION: Patient tolerated therapy well with no adverse effects. Therapy focused on continued strengthening for core and proximal hip musculature. She was able to progress with weight for deadlift this visit and seems to be improving with her lumbopelvic control and endurance with exercises. She does report mild discomfort of lower back with exercises but this resolves shortly after therapy. No changes made to HEP this visit. Patient would benefit from continued skilled PT to progress her strength and control in order to reduce pain and maximize her functional ability.  Objective impairments include decreased endurance, decreased strength, improper body mechanics, postural dysfunction, and pain.    GOALS: Goals reviewed with patient? Yes   SHORT TERM GOALS:   STG Name Target Date Goal status  1 Patient will be I with initial HEP in order to progress with therapy. Baseline: independent with initial HEP 07/08/2021 ACHIEVED  2 Patient will report pain no more than 6/10 with all activity to reduce functional limitations. Baseline: pain up to 4/10 07/08/2021 ACHIEVED  3 Patient will demonstrate proper lifting mechanics to improve lumbopelvic control and reduce back pain Baseline: patient exhibits proper lifting mechanics 07/08/2021 ACHIEVED    LONG TERM GOALS:    LTG Name Target Date Goal status  1 Patient will be I with final HEP to maintain progress from PT. Baseline: progressing HEP 08/05/2021 ONGOING  2 Patient will report improved functional  ability with </= 3/50 modified oswestry Baseline: 08/05/2021 INITIAL  3 Patient will demonstrate core and hip strength >/= 4/5 MMT in order to reduce pain with lifting, standing and walking extended periods Baseline: continues to demonstrate core and hip weakness 08/05/2021 ONGOING  4 Patient will report no more than 3/10 with all activity and standing tasks to reduce functional limitations Baseline: up to 4/10 pain 08/05/2021 ONGOING      PLAN: PT FREQUENCY: 1x/week   PT DURATION: 8 weeks   PLANNED INTERVENTIONS: Therapeutic exercises, Therapeutic activity, Neuro Muscular re-education, Balance training, Gait training, Patient/Family education, Joint mobilization, Aquatic Therapy, Dry Needling, Electrical stimulation, Spinal mobilization, Cryotherapy, Moist heat, and Manual therapy   PLAN FOR NEXT SESSION: Review HEP and progress  PRN, focus on core strengthening and progress lifting mechanics   Hilda Blades, PT, DPT, LAT, ATC 07/29/21  11:32 AM Phone: [phone] Fax: [phone]

## 2021-07-29 ENCOUNTER — Ambulatory Visit: Payer: Medicaid Other | Admitting: Physical Therapy

## 2021-07-29 ENCOUNTER — Encounter: Payer: Self-pay | Admitting: Physical Therapy

## 2021-07-29 ENCOUNTER — Other Ambulatory Visit: Payer: Self-pay

## 2021-07-29 DIAGNOSIS — M545 Low back pain, unspecified: Secondary | ICD-10-CM | POA: Diagnosis not present

## 2021-07-29 DIAGNOSIS — M6281 Muscle weakness (generalized): Secondary | ICD-10-CM

## 2021-07-29 DIAGNOSIS — G8929 Other chronic pain: Secondary | ICD-10-CM

## 2021-08-04 NOTE — Therapy (Signed)
OUTPATIENT PHYSICAL THERAPY TREATMENT NOTE  DISCHARGE   Patient Name: Maria Bruce MRN: [id number] DOB:[birth year], 31 y.o., female Today's Date: 08/05/2021  PCP: Lajean Manes, MD REFERRING PROVIDER: Lajean Manes, MD   PT End of Session - 08/05/21 1052     Visit Number 8    Number of Visits 8    Date for PT Re-Evaluation 08/05/21    Authorization Type [employer]    Authorization - Number of Visits 27    PT Start Time 1051    PT Stop Time 1130    PT Time Calculation (min) 39 min    Activity Tolerance Patient tolerated treatment well    Behavior During Therapy WFL for tasks assessed/performed                   Past Medical History:  Diagnosis Date   Asthma    as a child   Past Surgical History:  Procedure Laterality Date   NO PAST SURGERIES     Patient Active Problem List   Diagnosis Date Noted   Healthcare maintenance 05/12/2021   Prediabetes 05/12/2021   Other allergic rhinitis 08/21/2020   Allergic conjunctivitis of both eyes 08/21/2020   History of asthma 08/21/2020   Chronic nasal congestion 07/05/2020   Difficulty sleeping 07/05/2020   Chronic left-sided low back pain without sciatica 07/05/2020   BMI 40.0-44.9, adult ([place]) 11/02/2019    REFERRING PROVIDER: Velna Ochs, MD   REFERRING DIAG: Chronic left-sided low back pain without sciatica  THERAPY DIAG:  Chronic left-sided low back pain, unspecified whether sciatica present  Muscle weakness (generalized)  ONSET DATE: worsening past 3 years  PERTINENT HISTORY: None  PRECAUTIONS: None WEIGHT BEARING RESTRICTIONS: No  SUBJECTIVE: Patient she is doing well, she has been doing yard work without any issues. She got a gym membership so plans to begin working out at [employer].  PAIN:  Are you having pain? No VAS scale: 1/10 (3/10 at worst) Pain location: Lower back Pain orientation: Left  PAIN TYPE: Chronic Pain description: intermittent, burning Aggravating factors: Walking,  standing extended periods, lifting heavy or constantly Relieving factors: Heating pad, muscle cream  PATIENT GOALS: Pain relief and find things to do to feel better   OBJECTIVE: (BOLDED MEASURES ASSESSED THIS VISIT) PATIENT SURVEYS:  Modified Oswestry 4/50 - 08/05/2021   LUMBAR AROM   AROM AROM  06/10/2021  07/15/2021  08/05/2021  Flexion WFL WFL WFL  Extension [facility] WFL WFL  Right lateral flexion [facility] WFL  Left lateral flexion [facility] WFL  Right rotation [facility] WFL  Left rotation WFL - mild left sided discomfort [facility] WFL   LE MMT:                        Core strength grossly 4/5 MMT   MMT Right 06/10/2021 Left 06/10/2021 Right / Left 07/15/2021 Right / Left 07/24/2021 Right / Left 08/05/2021  Hip flexion 4+ 4+     Hip extension 4 4- 4 / 4  4 / 4  Hip abduction _0 / 4 4+/4+ 4+/4+  Knee flexion 5 5     Knee extension 5 5      FUNCTIONAL TESTS:  Lifting: patient able to perform proper hip hinge technique and maintain lumbopelvic control with consistent cueing for technique - 06/24/2021     TODAY'S TREATMENT: 08/05/2021: Elliptical L2 R2 x 5 min while taking subjective Modified side plank hold with clamshell 2 x 10  each Figure-4 bridge 2 x 10 each Modified front plank 2 x 30 sec Deadlift 45# 3 x 10 Pallof press with FM 10# x 10, blue band x 10 Sidelying hip abduction 2 x 10 each   07/29/2021: Recumbent bike L2 x 5 min while taking subjective Figure-4 bridge x 10 each Modified side plank hold with clamshell 2 x 10 each Modified front plank 3 x 30 sec Deadlift 45# 3 x 10 Pallof press with green tubing 2 x 10 each Farmer's carry 25# with green power band 2 x 20 steps each Springboard bar pull down with yellow springs 2 x 10  07/24/2021: LTR in figure-4 position 3 x 10 sec Piriformis stretch 2 x 30 sec each Figure-4 bridge 2 x 10 90-90 alternating heel tap 2 x 5 Dead bugs 2 x 10 Modified side plank 3 x 20 sec hold each Modified front plank 3 x 20 sec hold Bird dogs  with dowel on lumbar for pelvic control Childs pose x 20 sec Hip abduction 12.5# 2 x 10 BIL Squat to mat table 2 x 10   PATIENT EDUCATION:  Education details: POC discharge, HEP Person educated: Patient Education method: Consulting civil engineer, Demonstration Education comprehension: verbalized understanding, returned demonstration   HOME EXERCISE PROGRAM: Access Code: [email or web address]     ASSESSMENT: CLINICAL IMPRESSION: Patient tolerated therapy well with no adverse effects. Patient has made great progress with PT and demonstrates improved strength and function, with less pain reported. She is independent with her current HEP and is planning to begin gym exercise in order to further progress her strength. Overall she is doing very well and is ready to be discharged from PT.  Objective impairments include decreased endurance, decreased strength, improper body mechanics, postural dysfunction, and pain.    GOALS: Goals reviewed with patient? Yes   SHORT TERM GOALS:   STG Name Target Date Goal status  1 Patient will be I with initial HEP in order to progress with therapy. Baseline: independent with initial HEP 07/08/2021 ACHIEVED  2 Patient will report pain no more than 6/10 with all activity to reduce functional limitations. Baseline: pain up to 4/10 07/08/2021 ACHIEVED  3 Patient will demonstrate proper lifting mechanics to improve lumbopelvic control and reduce back pain Baseline: patient exhibits proper lifting mechanics 07/08/2021 ACHIEVED    LONG TERM GOALS:    LTG Name Target Date Goal status  1 Patient will be I with final HEP to maintain progress from PT. Baseline: independent with HEP 08/05/2021 ACHIEVED  2 Patient will report improved functional ability with </= 3/50 modified oswestry Baseline: 4/50 08/05/2021 PARTIALLY MET  3 Patient will demonstrate core and hip strength >/= 4/5 MMT in order to reduce pain with lifting, standing and walking extended periods Baseline: grossly 4/5 MMT  08/05/2021 ACHIEVED  4 Patient will report no more than 3/10 with all activity and standing tasks to reduce functional limitations Baseline: up to 3/10 pain 08/05/2021 ACHIEVED      PLAN: PT FREQUENCY: 1x/week   PT DURATION: 8 weeks   PLANNED INTERVENTIONS: Therapeutic exercises, Therapeutic activity, Neuro Muscular re-education, Balance training, Gait training, Patient/Family education, Joint mobilization, Aquatic Therapy, Dry Needling, Electrical stimulation, Spinal mobilization, Cryotherapy, Moist heat, and Manual therapy   PLAN FOR NEXT SESSION: N/A discharge   Hilda Blades, PT, DPT, LAT, ATC 08/05/21  1:33 PM Phone: [phone] Fax: [phone]    PHYSICAL THERAPY DISCHARGE SUMMARY  Visits from Start of Care: 8  Current functional level related to goals / functional outcomes: See above  Remaining deficits: See above   Education / Equipment: HEP   Patient agrees to discharge. Patient goals were partially met. Patient is being discharged due to being pleased with the current functional level.

## 2021-08-05 ENCOUNTER — Other Ambulatory Visit: Payer: Self-pay

## 2021-08-05 ENCOUNTER — Ambulatory Visit: Payer: Medicaid Other | Admitting: Physical Therapy

## 2021-08-05 ENCOUNTER — Encounter: Payer: Self-pay | Admitting: Physical Therapy

## 2021-08-05 DIAGNOSIS — M545 Low back pain, unspecified: Secondary | ICD-10-CM

## 2021-08-05 DIAGNOSIS — M6281 Muscle weakness (generalized): Secondary | ICD-10-CM

## 2021-08-05 NOTE — Patient Instructions (Signed)
Access Code: [email or web address] URL: [email or web address] Date: 08/05/2021 Prepared by: Rosana Hoes  Exercises Supine Lower Trunk Rotation - 2-3 x weekly - 5 reps - 10 secnds hold Marching Bridge - 2-3 x weekly - 3 sets - 5 reps Sidelying Hip Abduction - 2-3 x weekly - 2 sets - 10 reps Side Plank on Knees - 2-3 x weekly - 3 reps - 30 seconds hold Plank on Knees - 2-3 x weekly - 3 reps - 30 seconds hold Kettlebell Deadlift - 2-3 x weekly - 3 sets - 10 reps Standing Anti-Rotation Press with Anchored Resistance - 2-3 x weekly - 3 sets - 10 reps Goblet Squat with Kettlebell - 2-3 x weekly - 3 sets - 10 reps

## 2021-08-12 ENCOUNTER — Telehealth: Payer: Self-pay

## 2021-08-12 MED ORDER — HYDROXYZINE HCL 50 MG PO TABS: 50.0000 mg | ORAL_TABLET | Freq: Three times a day (TID) | ORAL | 0 refills | Status: DC | PRN

## 2021-08-12 NOTE — Telephone Encounter (Signed)
Pt is requesting a call back she stated that she is going to the dentist tomorrow to have teeth removed and she is wanting to know if she can have something for her appt  ?

## 2021-08-12 NOTE — Telephone Encounter (Signed)
Return pt's call - no answer; left message to call the office back if she needs to speak to someone. ?

## 2021-08-12 NOTE — Telephone Encounter (Signed)
Return pt's call - stated she's having her wisdom teeth removed tomorrow @ 0900 am and requesting something for the anxiety. I asked her if she had talked to her dentist - she stated they told her to call her PCP. ?

## 2021-10-21 ENCOUNTER — Ambulatory Visit (INDEPENDENT_AMBULATORY_CARE_PROVIDER_SITE_OTHER): Payer: Medicaid Other

## 2021-10-21 VITALS — BP 121/72 | HR 92 | Wt 256.6 lb

## 2021-10-21 DIAGNOSIS — Z3042 Encounter for surveillance of injectable contraceptive: Secondary | ICD-10-CM

## 2021-10-21 LAB — POCT PREGNANCY, URINE: Preg Test, Ur: NEGATIVE

## 2021-10-21 MED ORDER — MEDROXYPROGESTERONE ACETATE 150 MG/ML IM SUSP
150.0000 mg | Freq: Once | INTRAMUSCULAR | Status: AC
  Administered 2021-10-21: 150 mg via INTRAMUSCULAR

## 2021-10-21 NOTE — Progress Notes (Signed)
[facility] here for restart of Depo-Provera Injections. Last injection given 06/23/21. Denies unprotected intercourse within past 2 weeks. UPT today is negative. Injection administered without complication. Patient will return in 3 months for next injection between 01/06/22 and 01/20/22. Next annual visit due January 2024.   Marjo Bicker, RN 10/21/2021  3:29 PM

## 2021-11-30 ENCOUNTER — Encounter: Payer: Self-pay | Admitting: *Deleted

## 2022-01-06 ENCOUNTER — Ambulatory Visit (INDEPENDENT_AMBULATORY_CARE_PROVIDER_SITE_OTHER): Payer: Medicaid Other

## 2022-01-06 ENCOUNTER — Other Ambulatory Visit: Payer: Self-pay

## 2022-01-06 VITALS — BP 115/75 | HR 97 | Wt 263.8 lb

## 2022-01-06 DIAGNOSIS — Z3042 Encounter for surveillance of injectable contraceptive: Secondary | ICD-10-CM | POA: Diagnosis not present

## 2022-01-06 MED ORDER — MEDROXYPROGESTERONE ACETATE 150 MG/ML IM SUSP
150.0000 mg | Freq: Once | INTRAMUSCULAR | Status: AC
  Administered 2022-01-06: 150 mg via INTRAMUSCULAR

## 2022-01-06 NOTE — Progress Notes (Signed)
[facility] here for Depo-Provera Injection. Injection administered without complication. Patient will return in 3 months for next injection between 03/24/22 and 04/07/22. Next annual visit due January 2024.   Pt reports concern regarding weight gain while on Depo Provera for past 4 years. States she would like to change birth control methods. Appt schedule with Shawnie Pons, MD for birth control consult on 02/12/22.  Marjo Bicker, RN 01/06/2022  9:27 AM

## 2022-02-12 ENCOUNTER — Other Ambulatory Visit: Payer: Self-pay

## 2022-02-12 ENCOUNTER — Encounter: Payer: Self-pay | Admitting: Family Medicine

## 2022-02-12 ENCOUNTER — Ambulatory Visit (INDEPENDENT_AMBULATORY_CARE_PROVIDER_SITE_OTHER): Payer: Medicaid Other | Admitting: Family Medicine

## 2022-02-12 VITALS — BP 121/78 | HR 81 | Wt 265.8 lb

## 2022-02-12 DIAGNOSIS — Z30011 Encounter for initial prescription of contraceptive pills: Secondary | ICD-10-CM | POA: Diagnosis not present

## 2022-02-12 MED ORDER — NORETHIN ACE-ETH ESTRAD-FE 1-20 MG-MCG(24) PO TABS: 1.0000 | ORAL_TABLET | Freq: Every day | ORAL | 3 refills | Status: DC

## 2022-02-12 NOTE — Progress Notes (Signed)
   Subjective:    Patient ID: Maria Bruce is a 31 y.o. female presenting with Contraception  on 02/12/2022  HPI: Maria Bruce today to discuss change in birth control. Has been on Depo and is in her window, but has had a lot of weight gain since being on, close to 50 lbs. Has tried OCPs in the past, and takes a med daily. Prefers to switch to this.  Review of Systems  Constitutional:  Negative for chills and fever.  Respiratory:  Negative for shortness of breath.   Cardiovascular:  Negative for chest pain.  Gastrointestinal:  Negative for abdominal pain, nausea and vomiting.  Genitourinary:  Negative for dysuria.  Skin:  Negative for rash.      Objective:    BP 121/78   Pulse 81   Wt 265 lb 12.8 oz (120.6 kg)   BMI 45.62 kg/m  Physical Exam Exam conducted with a chaperone present.  Constitutional:      General: She is not in acute distress.    Appearance: She is well-developed.  HENT:     Head: Normocephalic and atraumatic.  Eyes:     General: No scleral icterus. Cardiovascular:     Rate and Rhythm: Normal rate.  Pulmonary:     Effort: Pulmonary effort is normal.  Abdominal:     Palpations: Abdomen is soft.  Musculoskeletal:     Cervical back: Neck supple.  Skin:    General: Skin is warm and dry.  Neurological:     Mental Status: She is alert and oriented to person, place, and time.         Assessment & Plan:  Initiation of oral contraception - begin in 1 month. May take 9-12 monhts for depo to get out of system. Healthy weight magmt issues. Discussed LARCs as option. - Plan: Norethindrone Acetate-Ethinyl Estrad-FE (LOESTRIN 24 FE) 1-20 MG-MCG(24) tablet   Return if symptoms worsen or fail to improve.  Reva Bores, MD 02/12/2022 3:06 PM

## 2022-03-09 ENCOUNTER — Telehealth: Payer: Self-pay | Admitting: Family Medicine

## 2022-03-09 NOTE — Telephone Encounter (Signed)
Patient have a few questions about her Birth Control, she said she can't remember if she was suppose to take start on 10-1 and also if she don't want her cycle for her not to take the last row of pills.

## 2022-03-09 NOTE — Telephone Encounter (Signed)
Call placed back to pt. Pt states wanting to know if ok to start OCP pill pack today and if ok to skip last 4 placebo pills to not have cycle. Pt advised ok to start new pill pack today. Pt is transitioning  from Depo Provera per Dr Virginia Crews note on 02/12/22. Pt advised ok to skip placebos and start new pack to skip cycle. Pt advised to use back up method for 1-2 weeks as transition to pill. Pt verbalized understanding and agreeable to plan of care.  Colletta [place], RNC

## 2022-09-27 DIAGNOSIS — J302 Other seasonal allergic rhinitis: Secondary | ICD-10-CM | POA: Insufficient documentation

## 2022-09-27 NOTE — Progress Notes (Deleted)
Follow Up Note  RE: Maria Bruce MRN: [id number] DOB: 1991-02-26 Date of Office Visit: 09/28/2022  Referring provider: Modena Slater, DO Primary care provider: Modena Slater, DO  Chief Complaint: No chief complaint on file.  History of Present Illness: I had the pleasure of seeing Maria Bruce for a follow up visit at the [facility] on 09/27/2022. Maria Bruce is a 32 y.o. female, who is being followed for allergic rhinoconjunctivitis and history of asthma. Her previous allergy office visit was on 08/21/2020 with Dr. Selena Batten. Today is a regular follow up visit.  Other allergic rhinitis Perennial rhinoconjunctivitis symptoms for the last 2+ years since Maria Bruce moved to [place].  Worse during the spring and summer.  Tried Allegra, Claritin, Flonase and over-the-counter eyedrops with some benefit.  No prior allergy/ENT evaluation.  3 cats at home. Today's skin testing showed: Positive to grass pollen, weed pollen, tree pollen, mold, cockroach, mouse, tobacco, cat, dust mites. Borderline positive to ragweed pollen.  Start environmental control measures as below. May use over the counter antihistamines such as Zyrtec (cetirizine), Claritin (loratadine), Allegra (fexofenadine), or Xyzal (levocetirizine) daily as needed. May take twice a day during flares. May use Flonase (fluticasone) nasal spray 1 spray per nostril twice a day as needed for nasal congestion.  May use azelastine nasal spray 1-2 sprays per nostril twice a day as needed for runny nose/drainage. Start Singulair (montelukast)  daily at night. Cautioned that in some children/adults can experience behavioral changes including hyperactivity, agitation, depression, sleep disturbances and suicidal ideations. These side effects are rare, but if you notice them you should notify me and discontinue Singulair (montelukast). May use Optivar (azelastine) eye drops 0.05% twice a day as needed for itchy/watery  eyes. Read about allergy injections - handout given.    Allergic conjunctivitis of both eyes See assessment and plan as above for allergic rhinitis.   History of asthma History of asthma as a child. No inhaler use recently. Today's spirometry was normal. Monitor symptoms.   Assessment and Plan: Lielle is a 32 y.o. female with: No problem-specific Assessment & Plan notes found for this encounter.  No follow-ups on file.  No orders of the defined types were placed in this encounter.  Lab Orders  No laboratory test(s) ordered today    Diagnostics: Spirometry:  Tracings reviewed. Her effort: {Blank single:19197::"Good reproducible efforts.","It was hard to get consistent efforts and there is a question as to whether this reflects a maximal maneuver.","Poor effort, data can not be interpreted."} FVC: ***L FEV1: ***L, ***% predicted FEV1/FVC ratio: ***% Interpretation: {Blank single:19197::"Spirometry consistent with mild obstructive disease","Spirometry consistent with moderate obstructive disease","Spirometry consistent with severe obstructive disease","Spirometry consistent with possible restrictive disease","Spirometry consistent with mixed obstructive and restrictive disease","Spirometry uninterpretable due to technique","Spirometry consistent with normal pattern","No overt abnormalities noted given today's efforts"}.  Please see scanned spirometry results for details.  Skin Testing: {Blank single:19197::"Select foods","Environmental allergy panel","Environmental allergy panel and select foods","Food allergy panel","None","Deferred due to recent antihistamines use"}. *** Results discussed with patient/family.   Medication List:  Current Outpatient Medications  Medication Sig Dispense Refill   azelastine (ASTELIN) 0.1 % nasal spray Place 1-2 sprays into both nostrils 2 (two) times daily as needed (runny nose). Use in each nostril as directed 30 mL 5   cetirizine (ZYRTEC  ALLERGY) 10 MG tablet Take 1 tablet (10 mg total) by mouth 2 (two) times daily. 60 tablet 5   fluticasone (FLONASE) 50 MCG/ACT nasal spray Place 1 spray into both  nostrils in the morning and at bedtime. For nasal congestion 16 g 5   medroxyPROGESTERone (DEPO-PROVERA) 150 MG/ML injection Inject 150 mg into the muscle every 3 (three) months.     montelukast (SINGULAIR) 10 MG tablet Take 1 tablet (10 mg total) by mouth at bedtime. 30 tablet 5   Norethindrone Acetate-Ethinyl Estrad-FE (LOESTRIN 24 FE) 1-20 MG-MCG(24) tablet Take 1 tablet by mouth daily. 84 tablet 3   No current facility-administered medications for this visit.   Allergies: No Known Allergies I reviewed her past medical history, social history, family history, and environmental history and no significant changes have been reported from her previous visit.  Review of Systems  Constitutional:  Negative for appetite change, chills, fever and unexpected weight change.  HENT:  Negative for congestion and rhinorrhea.   Eyes:  Negative for itching.  Respiratory:  Negative for cough, chest tightness, shortness of breath and wheezing.   Gastrointestinal:  Negative for abdominal pain.  Skin:  Negative for rash.  Allergic/Immunologic: Positive for environmental allergies.  Neurological:  Negative for headaches.    Objective: There were no vitals taken for this visit. There is no height or weight on file to calculate BMI. Physical Exam Vitals and nursing note reviewed.  Constitutional:      Appearance: Normal appearance. Maria Bruce is well-developed.  HENT:     Head: Normocephalic and atraumatic.     Right Ear: Tympanic membrane and external ear normal.     Left Ear: Tympanic membrane and external ear normal.     Nose: Nose normal.     Mouth/Throat:     Mouth: Mucous membranes are moist.     Pharynx: Oropharynx is clear.  Eyes:     Conjunctiva/sclera: Conjunctivae normal.  Cardiovascular:     Rate and Rhythm: Normal rate and regular  rhythm.     Heart sounds: Normal heart sounds. No murmur heard. Pulmonary:     Effort: Pulmonary effort is normal.     Breath sounds: Normal breath sounds. No wheezing, rhonchi or rales.  Musculoskeletal:     Cervical back: Neck supple.  Skin:    General: Skin is warm.     Findings: No rash.  Neurological:     Mental Status: Maria Bruce is alert and oriented to person, place, and time.  Psychiatric:        Behavior: Behavior normal.    Previous notes and tests were reviewed. The plan was reviewed with the patient/family, and all questions/concerned were addressed.  It was my pleasure to see Maria Bruce today and participate in her care. Please feel free to contact me with any questions or concerns.  Sincerely,  Wyline Mood, DO Allergy & Immunology  [facility] office: [phone] [facility] office: [phone]

## 2022-09-28 ENCOUNTER — Ambulatory Visit: Payer: Medicaid Other | Admitting: Allergy

## 2022-09-28 DIAGNOSIS — J302 Other seasonal allergic rhinitis: Secondary | ICD-10-CM

## 2022-09-28 DIAGNOSIS — Z8709 Personal history of other diseases of the respiratory system: Secondary | ICD-10-CM

## 2022-09-29 ENCOUNTER — Ambulatory Visit: Payer: Medicaid Other | Admitting: Internal Medicine

## 2022-10-27 ENCOUNTER — Encounter: Payer: Self-pay | Admitting: Student

## 2023-09-13 ENCOUNTER — Other Ambulatory Visit: Payer: Self-pay | Admitting: Lactation Services

## 2023-09-13 ENCOUNTER — Encounter: Payer: Self-pay | Admitting: Lactation Services

## 2023-09-13 DIAGNOSIS — Z30011 Encounter for initial prescription of contraceptive pills: Secondary | ICD-10-CM

## 2023-09-13 MED ORDER — NORETHIN ACE-ETH ESTRAD-FE 1-20 MG-MCG(24) PO TABS: 1.0000 | ORAL_TABLET | Freq: Every day | ORAL | 0 refills | Status: DC

## 2023-09-13 NOTE — Progress Notes (Signed)
 Sent in 3 months OCP's at patients request per standing protocol. Message sent to patient that she is past due for her annual exam and to call the office to schedule follow up.

## 2024-02-01 ENCOUNTER — Encounter: Payer: Self-pay | Admitting: Lactation Services

## 2024-02-01 ENCOUNTER — Other Ambulatory Visit: Payer: Self-pay | Admitting: Lactation Services

## 2024-02-01 DIAGNOSIS — Z30011 Encounter for initial prescription of contraceptive pills: Secondary | ICD-10-CM

## 2024-02-01 MED ORDER — NORETHIN ACE-ETH ESTRAD-FE 1-20 MG-MCG(24) PO TABS: 1.0000 | ORAL_TABLET | Freq: Every day | ORAL | 1 refills | Status: AC

## 2024-03-31 ENCOUNTER — Ambulatory Visit: Admitting: Physician Assistant

## 2024-04-07 ENCOUNTER — Ambulatory Visit: Admitting: Obstetrics and Gynecology

## 2024-05-30 ENCOUNTER — Ambulatory Visit: Admitting: Family Medicine
